# Patient Record
Sex: Male | Born: 1975 | Race: White | Hispanic: No | Marital: Married | Smoking: Former smoker
Health system: Southern US, Community
[De-identification: ages and names within clinical notes are randomized; demographics above are authoritative.]

## PROBLEM LIST (undated history)

## (undated) ENCOUNTER — Ambulatory Visit (HOSPITAL_COMMUNITY): Admission: EM | Payer: Medicare HMO | Source: Home / Self Care

## (undated) DIAGNOSIS — E785 Hyperlipidemia, unspecified: Secondary | ICD-10-CM

## (undated) DIAGNOSIS — R569 Unspecified convulsions: Secondary | ICD-10-CM

## (undated) DIAGNOSIS — J45909 Unspecified asthma, uncomplicated: Secondary | ICD-10-CM

## (undated) HISTORY — PX: HAND SURGERY: SHX662

## (undated) HISTORY — DX: Unspecified asthma, uncomplicated: J45.909

---

## 1999-08-24 ENCOUNTER — Emergency Department (HOSPITAL_COMMUNITY): Admission: EM | Admit: 1999-08-24 | Discharge: 1999-08-24 | Payer: Self-pay | Admitting: Emergency Medicine

## 1999-08-24 ENCOUNTER — Encounter: Payer: Self-pay | Admitting: Emergency Medicine

## 1999-08-26 ENCOUNTER — Emergency Department (HOSPITAL_COMMUNITY): Admission: EM | Admit: 1999-08-26 | Discharge: 1999-08-26 | Payer: Self-pay | Admitting: Internal Medicine

## 1999-09-19 ENCOUNTER — Encounter: Payer: Self-pay | Admitting: Emergency Medicine

## 1999-09-19 ENCOUNTER — Emergency Department (HOSPITAL_COMMUNITY): Admission: EM | Admit: 1999-09-19 | Discharge: 1999-09-19 | Payer: Self-pay | Admitting: *Deleted

## 2002-07-14 ENCOUNTER — Emergency Department (HOSPITAL_COMMUNITY): Admission: EM | Admit: 2002-07-14 | Discharge: 2002-07-14 | Payer: Self-pay | Admitting: Emergency Medicine

## 2004-05-10 ENCOUNTER — Other Ambulatory Visit: Payer: Self-pay

## 2009-06-15 ENCOUNTER — Ambulatory Visit: Payer: Self-pay | Admitting: Family Medicine

## 2009-09-27 ENCOUNTER — Emergency Department: Payer: Self-pay | Admitting: Emergency Medicine

## 2010-04-03 ENCOUNTER — Ambulatory Visit: Payer: Self-pay | Admitting: Unknown Physician Specialty

## 2010-09-12 ENCOUNTER — Encounter: Payer: Self-pay | Admitting: Family Medicine

## 2010-09-28 ENCOUNTER — Encounter: Payer: Self-pay | Admitting: Family Medicine

## 2011-03-21 ENCOUNTER — Ambulatory Visit: Payer: Self-pay | Admitting: Unknown Physician Specialty

## 2011-03-22 LAB — PATHOLOGY REPORT

## 2011-06-02 ENCOUNTER — Emergency Department: Payer: Self-pay | Admitting: Emergency Medicine

## 2011-06-17 ENCOUNTER — Ambulatory Visit: Payer: Self-pay | Admitting: Internal Medicine

## 2011-11-05 ENCOUNTER — Ambulatory Visit: Payer: Self-pay | Admitting: Surgery

## 2012-06-19 ENCOUNTER — Ambulatory Visit: Payer: Self-pay | Admitting: Unknown Physician Specialty

## 2012-06-20 LAB — PATHOLOGY REPORT

## 2012-06-29 ENCOUNTER — Emergency Department: Payer: Self-pay | Admitting: Unknown Physician Specialty

## 2012-06-29 LAB — COMPREHENSIVE METABOLIC PANEL
Albumin: 4 g/dL (ref 3.4–5.0)
Alkaline Phosphatase: 92 U/L (ref 50–136)
Anion Gap: 7 (ref 7–16)
BUN: 17 mg/dL (ref 7–18)
Bilirubin,Total: 0.6 mg/dL (ref 0.2–1.0)
Calcium, Total: 9 mg/dL (ref 8.5–10.1)
Chloride: 107 mmol/L (ref 98–107)
Co2: 24 mmol/L (ref 21–32)
Creatinine: 1.1 mg/dL (ref 0.60–1.30)
EGFR (African American): >60
EGFR (Non-African Amer.): >60
Glucose: 100 mg/dL — ABNORMAL HIGH (ref 65–99)
Osmolality: 277 (ref 275–301)
Potassium: 3.7 mmol/L (ref 3.5–5.1)
SGOT(AST): 20 U/L (ref 15–37)
SGPT (ALT): 18 U/L (ref 12–78)
Sodium: 138 mmol/L (ref 136–145)
Total Protein: 7.6 g/dL (ref 6.4–8.2)

## 2012-06-29 LAB — URINALYSIS, COMPLETE
Bilirubin,UR: NEGATIVE
Glucose,UR: NEGATIVE mg/dL (ref 0–75)
Ketone: NEGATIVE
Leukocyte Esterase: NEGATIVE
Nitrite: NEGATIVE
Ph: 5 (ref 4.5–8.0)
Protein: 100
RBC,UR: 1409 /HPF (ref 0–5)
Specific Gravity: 1.023 (ref 1.003–1.030)
Squamous Epithelial: NONE SEEN
WBC UR: 1 /HPF (ref 0–5)

## 2012-06-29 LAB — CBC
HCT: 49 % (ref 40.0–52.0)
HGB: 17 g/dL (ref 13.0–18.0)
MCH: 32 pg (ref 26.0–34.0)
MCHC: 34.8 g/dL (ref 32.0–36.0)
MCV: 92 fL (ref 80–100)
Platelet: 211 10*3/uL (ref 150–440)
RBC: 5.33 10*6/uL (ref 4.40–5.90)
RDW: 13.9 % (ref 11.5–14.5)
WBC: 5.6 10*3/uL (ref 3.8–10.6)

## 2012-06-29 LAB — LIPASE, BLOOD: Lipase: 130 U/L (ref 73–393)

## 2012-08-12 DIAGNOSIS — R200 Anesthesia of skin: Secondary | ICD-10-CM | POA: Insufficient documentation

## 2012-08-12 DIAGNOSIS — M549 Dorsalgia, unspecified: Secondary | ICD-10-CM | POA: Insufficient documentation

## 2012-08-18 ENCOUNTER — Ambulatory Visit: Payer: Self-pay | Admitting: Internal Medicine

## 2012-08-23 ENCOUNTER — Emergency Department: Payer: Self-pay | Admitting: *Deleted

## 2012-08-23 LAB — BASIC METABOLIC PANEL
Anion Gap: 10 (ref 7–16)
BUN: 17 mg/dL (ref 7–18)
Calcium, Total: 9 mg/dL (ref 8.5–10.1)
Chloride: 108 mmol/L — ABNORMAL HIGH (ref 98–107)
Co2: 25 mmol/L (ref 21–32)
Creatinine: 1.28 mg/dL (ref 0.60–1.30)
EGFR (African American): >60
EGFR (Non-African Amer.): >60
Glucose: 99 mg/dL (ref 65–99)
Osmolality: 287 (ref 275–301)
Potassium: 3.5 mmol/L (ref 3.5–5.1)
Sodium: 143 mmol/L (ref 136–145)

## 2012-08-23 LAB — CBC
HCT: 48.7 % (ref 40.0–52.0)
HGB: 16.8 g/dL (ref 13.0–18.0)
MCH: 32.3 pg (ref 26.0–34.0)
MCV: 94 fL (ref 80–100)
Platelet: 220 10*3/uL (ref 150–440)
RBC: 5.19 10*6/uL (ref 4.40–5.90)
RDW: 13.7 % (ref 11.5–14.5)

## 2012-08-23 LAB — URINALYSIS, COMPLETE
Glucose,UR: NEGATIVE mg/dL (ref 0–75)
Ketone: NEGATIVE
Nitrite: NEGATIVE
Protein: 30
RBC,UR: 391 /HPF (ref 0–5)
WBC UR: NONE SEEN /HPF (ref 0–5)

## 2012-08-25 ENCOUNTER — Ambulatory Visit: Payer: Self-pay | Admitting: Internal Medicine

## 2012-09-01 ENCOUNTER — Encounter: Payer: Self-pay | Admitting: Internal Medicine

## 2012-09-10 DIAGNOSIS — N200 Calculus of kidney: Secondary | ICD-10-CM | POA: Insufficient documentation

## 2012-09-28 ENCOUNTER — Encounter: Payer: Self-pay | Admitting: Internal Medicine

## 2013-02-16 DIAGNOSIS — M25569 Pain in unspecified knee: Secondary | ICD-10-CM | POA: Insufficient documentation

## 2013-05-25 ENCOUNTER — Emergency Department: Payer: Self-pay | Admitting: Emergency Medicine

## 2013-05-25 LAB — COMPREHENSIVE METABOLIC PANEL
Albumin: 3.5 g/dL (ref 3.4–5.0)
Alkaline Phosphatase: 136 U/L (ref 50–136)
Anion Gap: 5 — ABNORMAL LOW (ref 7–16)
Bilirubin,Total: 0.6 mg/dL (ref 0.2–1.0)
Calcium, Total: 8.7 mg/dL (ref 8.5–10.1)
Chloride: 107 mmol/L (ref 98–107)
EGFR (African American): >60
EGFR (Non-African Amer.): >60
Potassium: 3.9 mmol/L (ref 3.5–5.1)
SGOT(AST): 53 U/L — ABNORMAL HIGH (ref 15–37)
Sodium: 137 mmol/L (ref 136–145)

## 2013-05-25 LAB — URINALYSIS, COMPLETE
Bilirubin,UR: NEGATIVE
Blood: NEGATIVE
Glucose,UR: NEGATIVE mg/dL (ref 0–75)
Protein: 30
RBC,UR: 2 /HPF (ref 0–5)
WBC UR: 3 /HPF (ref 0–5)

## 2013-05-25 LAB — CBC
MCV: 91 fL (ref 80–100)
RBC: 5.4 10*6/uL (ref 4.40–5.90)
RDW: 14 % (ref 11.5–14.5)

## 2014-06-15 DIAGNOSIS — J309 Allergic rhinitis, unspecified: Secondary | ICD-10-CM | POA: Insufficient documentation

## 2014-06-15 DIAGNOSIS — J45909 Unspecified asthma, uncomplicated: Secondary | ICD-10-CM | POA: Insufficient documentation

## 2014-06-15 DIAGNOSIS — K219 Gastro-esophageal reflux disease without esophagitis: Secondary | ICD-10-CM | POA: Insufficient documentation

## 2014-06-15 HISTORY — DX: Unspecified asthma, uncomplicated: J45.909

## 2015-01-18 ENCOUNTER — Emergency Department: Payer: Self-pay | Admitting: Emergency Medicine

## 2015-02-15 NOTE — Consult Note (Signed)
PATIENT NAME:  Matthew Wade, Matthew Wade MR#:  [id number] DATE OF BIRTH:  [birth year]  DATE OF CONSULTATION:  06/19/2012  REFERRING PHYSICIAN:   CONSULTING PHYSICIAN:  Manya Silvas, MD  HISTORY OF PRESENT ILLNESS:  The patient is a 39 year old white male who had an upper endoscopy today for followup of duodenal adenomatous polyp.  He in the recovery room complained of heavy chest pain. He said this never happened to him before after his previous endoscopies. The patient did talk to me before the procedure about the amazing stress he is under because his infant daughter had heart surgery for probable ventral septal defect and that there are other problems as well with miscarriages by his wife.  After the procedure he did complain of  chest discomfort. When I pressed on his sternum there was some discomfort as well.  No epigastric discomfort. He did have a small hiatal hernia on endoscopy. An EKG was ordered and done and it showed mild sinus bradycardia without any ST-T wave significant changes. His chest was clear. Heart showed no murmurs or gallops I could hear.  His vital signs were stable. He was given some Mylanta and was told to take his omeprazole. He was feeling better before he was wheeled out to his car.   ____________________________ Manya Silvas, MD rte:bjt D: 06/19/2012 13:29:18 ET T: 06/19/2012 13:59:33 ET JOB#: [id number]  cc: Manya Silvas, MD, <Dictator> Manya Silvas MD ELECTRONICALLY SIGNED 07/08/2012 13:57

## 2015-02-20 NOTE — Op Note (Signed)
PATIENT NAME:  Matthew Wade, Matthew Wade MR#:  [id number] DATE OF BIRTH:  21-Jun-1976  DATE OF PROCEDURE:  11/05/2011  PREOPERATIVE DIAGNOSIS: Ventral hernia.   POSTOPERATIVE DIAGNOSIS: Ventral hernia.   PROCEDURE: Ventral hernia repair.   SURGEON: Loreli Dollar, MD  ANESTHESIA: General.   INDICATIONS: This 39 year old male has a history of a painful mass in the epigastrium and a hernia was demonstrated on physical exam and repair recommended for definitive treatment.   DESCRIPTION OF PROCEDURE: The patient was placed on the operating table in the supine position under general anesthesia. The abdomen was clipped and prepared with ChloraPrep, draped in a sterile manner.   An epigastric incision was made approximately 4 cm in length just above the navel, was carried down through the subcutaneous tissues. Electrocautery was used for hemostasis. There was herniated properitoneal fat which was dissected free from surrounding structures up to the fascial ring defect. The properitoneal fat was incarcerated and it was necessary to enlarge the fascial defect on the left medial side of the defect to allow reduction of the incarcerated properitoneal fat. There appeared to be diastases recti somewhat thin fascial layer. There was another smaller hernia defect just about 8 mm below this one which had just a small amount of properitoneal fat coming up through a hole that was just about 4 mm in dimension. That fatty tissue was amputated and that hole was repaired with a 0 Surgilon simple suture. Next properitoneal fat was dissected away from the fascia with finger dissection both above and below the defect and then selected an Atrium mesh and cut to create an oval shape of 2 x 3 cm and this was placed into the properitoneal plane, sutured to the overlying fascia with 0 Surgilon. Next, the hernia defect was closed with a transversely oriented suture line of interrupted 0 Surgilon simple and figure-of-eight sutures. The  repair looked good. Subcutaneous tissues were closed with 2-0 chromic to avoid dead space and the skin was closed with running 4-0 chromic subcuticular suture and Dermabond.   The patient tolerated surgery satisfactorily and was then prepared for transfer to the recovery room.  ____________________________ Lenna Sciara. Rochel Brome, MD jws:cms D: 11/05/2011 09:57:01 ET T: 11/05/2011 11:21:27 ET JOB#: [id number]  cc: Loreli Dollar, MD, <Dictator> Loreli Dollar MD ELECTRONICALLY SIGNED 12/02/2011 15:02

## 2015-05-17 ENCOUNTER — Emergency Department (HOSPITAL_COMMUNITY): Payer: Medicare Other

## 2015-05-17 ENCOUNTER — Encounter (HOSPITAL_COMMUNITY): Payer: Self-pay | Admitting: Physical Medicine and Rehabilitation

## 2015-05-17 ENCOUNTER — Emergency Department (HOSPITAL_COMMUNITY)
Admission: EM | Admit: 2015-05-17 | Discharge: 2015-05-17 | Disposition: A | Discharge status: Home / Self Care | Payer: Medicare Other | Attending: Emergency Medicine | Admitting: Emergency Medicine

## 2015-05-17 DIAGNOSIS — G8929 Other chronic pain: Secondary | ICD-10-CM | POA: Diagnosis not present

## 2015-05-17 DIAGNOSIS — R110 Nausea: Secondary | ICD-10-CM

## 2015-05-17 DIAGNOSIS — K219 Gastro-esophageal reflux disease without esophagitis: Secondary | ICD-10-CM | POA: Diagnosis not present

## 2015-05-17 DIAGNOSIS — J45909 Unspecified asthma, uncomplicated: Secondary | ICD-10-CM | POA: Diagnosis not present

## 2015-05-17 DIAGNOSIS — K2970 Gastritis, unspecified, without bleeding: Secondary | ICD-10-CM | POA: Insufficient documentation

## 2015-05-17 DIAGNOSIS — Z8639 Personal history of other endocrine, nutritional and metabolic disease: Secondary | ICD-10-CM | POA: Insufficient documentation

## 2015-05-17 DIAGNOSIS — R0789 Other chest pain: Secondary | ICD-10-CM | POA: Diagnosis not present

## 2015-05-17 DIAGNOSIS — R1013 Epigastric pain: Secondary | ICD-10-CM | POA: Diagnosis present

## 2015-05-17 DIAGNOSIS — Z720 Tobacco use: Secondary | ICD-10-CM | POA: Diagnosis not present

## 2015-05-17 DIAGNOSIS — R202 Paresthesia of skin: Secondary | ICD-10-CM | POA: Diagnosis not present

## 2015-05-17 DIAGNOSIS — Z87442 Personal history of urinary calculi: Secondary | ICD-10-CM | POA: Diagnosis not present

## 2015-05-17 HISTORY — DX: Hyperlipidemia, unspecified: E78.5

## 2015-05-17 LAB — CBC WITH DIFFERENTIAL/PLATELET
BASOS PCT: 0 % (ref 0–1)
Basophils Absolute: 0 10*3/uL (ref 0.0–0.1)
EOS PCT: 1 % (ref 0–5)
Eosinophils Absolute: 0.1 10*3/uL (ref 0.0–0.7)
HCT: 51.4 % (ref 39.0–52.0)
Hemoglobin: 18.3 g/dL — ABNORMAL HIGH (ref 13.0–17.0)
LYMPHS ABS: 1.7 10*3/uL (ref 0.7–4.0)
Lymphocytes Relative: 16 % (ref 12–46)
MCH: 32.4 pg (ref 26.0–34.0)
MCHC: 35.6 g/dL (ref 30.0–36.0)
MCV: 91 fL (ref 78.0–100.0)
MONOS PCT: 7 % (ref 3–12)
Monocytes Absolute: 0.7 10*3/uL (ref 0.1–1.0)
Neutro Abs: 8.1 10*3/uL — ABNORMAL HIGH (ref 1.7–7.7)
Neutrophils Relative %: 76 % (ref 43–77)
Platelets: 216 10*3/uL (ref 150–400)
RBC: 5.65 MIL/uL (ref 4.22–5.81)
RDW: 13.1 % (ref 11.5–15.5)
WBC: 10.6 10*3/uL — ABNORMAL HIGH (ref 4.0–10.5)

## 2015-05-17 LAB — COMPREHENSIVE METABOLIC PANEL
ALBUMIN: 4.4 g/dL (ref 3.5–5.0)
ALK PHOS: 71 U/L (ref 38–126)
ALT: 17 U/L (ref 17–63)
ANION GAP: 7 (ref 5–15)
AST: 17 U/L (ref 15–41)
BILIRUBIN TOTAL: 1.4 mg/dL — AB (ref 0.3–1.2)
BUN: 16 mg/dL (ref 6–20)
CHLORIDE: 105 mmol/L (ref 101–111)
CO2: 24 mmol/L (ref 22–32)
CREATININE: 1.12 mg/dL (ref 0.61–1.24)
Calcium: 9.6 mg/dL (ref 8.9–10.3)
GFR calc Af Amer: >60 mL/min (ref >60)
Glucose, Bld: 106 mg/dL — ABNORMAL HIGH (ref 65–99)
Potassium: 3.9 mmol/L (ref 3.5–5.1)
SODIUM: 136 mmol/L (ref 135–145)
Total Protein: 7.7 g/dL (ref 6.5–8.1)

## 2015-05-17 LAB — I-STAT TROPONIN, ED
Troponin i, poc: 0 ng/mL (ref 0.00–0.08)
Troponin i, poc: 0 ng/mL (ref 0.00–0.08)

## 2015-05-17 LAB — LIPASE, BLOOD: LIPASE: 19 U/L — AB (ref 22–51)

## 2015-05-17 MED ORDER — NITROGLYCERIN 0.4 MG SL SUBL: 0.4000 mg | SUBLINGUAL_TABLET | SUBLINGUAL | Status: DC | PRN

## 2015-05-17 MED ORDER — PROMETHAZINE HCL 25 MG PO TABS: 25.0000 mg | ORAL_TABLET | Freq: Four times a day (QID) | ORAL | Status: DC | PRN

## 2015-05-17 MED ORDER — ONDANSETRON 4 MG PO TBDP: 4.0000 mg | ORAL_TABLET | Freq: Three times a day (TID) | ORAL | Status: DC | PRN

## 2015-05-17 MED ORDER — GI COCKTAIL ~~LOC~~
30.0000 mL | Freq: Once | ORAL | Status: AC
  Administered 2015-05-17: 30 mL via ORAL
  Filled 2015-05-17: qty 30

## 2015-05-17 MED ORDER — OMEPRAZOLE 20 MG PO CPDR: 20.0000 mg | DELAYED_RELEASE_CAPSULE | Freq: Every day | ORAL | Status: DC

## 2015-05-17 MED ORDER — MORPHINE SULFATE 4 MG/ML IJ SOLN
4.0000 mg | Freq: Once | INTRAMUSCULAR | Status: AC
  Administered 2015-05-17: 4 mg via INTRAVENOUS
  Filled 2015-05-17: qty 1

## 2015-05-17 MED ORDER — HYDROCODONE-ACETAMINOPHEN 5-325 MG PO TABS: 1.0000 | ORAL_TABLET | Freq: Four times a day (QID) | ORAL | Status: DC | PRN

## 2015-05-17 MED ORDER — PANTOPRAZOLE SODIUM 40 MG IV SOLR
40.0000 mg | Freq: Once | INTRAVENOUS | Status: AC
  Administered 2015-05-17: 40 mg via INTRAVENOUS
  Filled 2015-05-17: qty 40

## 2015-05-17 MED ORDER — ASPIRIN 325 MG PO TABS
325.0000 mg | ORAL_TABLET | Freq: Once | ORAL | Status: AC
  Administered 2015-05-17: 325 mg via ORAL
  Filled 2015-05-17: qty 1

## 2015-05-17 MED ORDER — SODIUM CHLORIDE 0.9 % IV BOLUS (SEPSIS)
1000.0000 mL | Freq: Once | INTRAVENOUS | Status: AC
  Administered 2015-05-17: 1000 mL via INTRAVENOUS

## 2015-05-17 MED ORDER — ONDANSETRON HCL 4 MG/2ML IJ SOLN: 4.0000 mg | Freq: Once | INTRAMUSCULAR | Status: DC

## 2015-05-17 NOTE — Discharge Instructions (Signed)
Your abdominal pain is likely from gastritis from rebound reflux after running out of your omeprazole. You will need to continue taking omeprazole as directed, and avoid spicy/fatty/acidic foods and alcohol. Avoid laying down flat within 30 minutes of eating. Avoid NSAIDs like ibuprofen/aleve on an empty stomach. Use zofran as needed for nausea. Use norco as needed for pain but don't drive or operate machinery while taking this medication. STOP SMOKING! Follow up with your regular doctor in one week for ongoing evaluation of your symptoms. Return to the ER for changes or worsening symptoms.  Abdominal (belly) pain can be caused by many things. Your caregiver performed an examination and possibly ordered blood/urine tests and imaging (CT scan, x-rays, ultrasound). Many cases can be observed and treated at home after initial evaluation in the emergency department. Even though you are being discharged home, abdominal pain can be unpredictable. Therefore, you need a repeated exam if your pain does not resolve, returns, or worsens. Most patients with abdominal pain don't have to be admitted to the hospital or have surgery, but serious problems like appendicitis and gallbladder attacks can start out as nonspecific pain. Many abdominal conditions cannot be diagnosed in one visit, so follow-up evaluations are very important. SEEK IMMEDIATE MEDICAL ATTENTION IF YOU DEVELOP ANY OF THE FOLLOWING SYMPTOMS:  The pain does not go away or becomes severe.   A temperature above 101 develops.   Repeated vomiting occurs (multiple episodes).   The pain becomes localized to portions of the abdomen. The right side could possibly be appendicitis. In an adult, the left lower portion of the abdomen could be colitis or diverticulitis.   Blood is being passed in stools or vomit (bright red or black tarry stools).   Return also if you develop chest pain, difficulty breathing, dizziness or fainting, or become confused, poorly  responsive, or inconsolable (young children).  The constipation stays for more than 4 days.   There is belly (abdominal) or rectal pain.   You do not seem to be getting better.      Abdominal Pain Many things can cause belly (abdominal) pain. Most times, the belly pain is not dangerous. Many cases of belly pain can be watched and treated at home. HOME CARE   Do not take medicines that help you go poop (laxatives) unless told to by your doctor.  Only take medicine as told by your doctor.  Eat or drink as told by your doctor. Your doctor will tell you if you should be on a special diet. GET HELP IF:  You do not know what is causing your belly pain.  You have belly pain while you are sick to your stomach (nauseous) or have runny poop (diarrhea).  You have pain while you pee or poop.  Your belly pain wakes you up at night.  You have belly pain that gets worse or better when you eat.  You have belly pain that gets worse when you eat fatty foods.  You have a fever. GET HELP RIGHT AWAY IF:   The pain does not go away within 2 hours.  You keep throwing up (vomiting).  The pain changes and is only in the right or left part of the belly.  You have bloody or tarry looking poop. MAKE SURE YOU:   Understand these instructions.  Will watch your condition.  Will get help right away if you are not doing well or get worse. Document Released: 04/02/2008 Document Revised: 10/20/2013 Document Reviewed: 06/24/2013 [employer] Patient Information 2015  [employer]. This information is not intended to replace advice given to you by your health care provider. Make sure you discuss any questions you have with your health care provider.  Gastritis, Adult Gastritis is soreness and swelling (inflammation) of the lining of the stomach. Gastritis can develop as a sudden onset (acute) or long-term (chronic) condition. If gastritis is not treated, it can lead to stomach bleeding and  ulcers. CAUSES  Gastritis occurs when the stomach lining is weak or damaged. Digestive juices from the stomach then inflame the weakened stomach lining. The stomach lining may be weak or damaged due to viral or bacterial infections. One common bacterial infection is the Helicobacter pylori infection. Gastritis can also result from excessive alcohol consumption, taking certain medicines, or having too much acid in the stomach.  SYMPTOMS  In some cases, there are no symptoms. When symptoms are present, they may include:  Pain or a burning sensation in the upper abdomen.  Nausea.  Vomiting.  An uncomfortable feeling of fullness after eating. DIAGNOSIS  Your caregiver may suspect you have gastritis based on your symptoms and a physical exam. To determine the cause of your gastritis, your caregiver may perform the following:  Blood or stool tests to check for the H pylori bacterium.  Gastroscopy. A thin, flexible tube (endoscope) is passed down the esophagus and into the stomach. The endoscope has a light and camera on the end. Your caregiver uses the endoscope to view the inside of the stomach.  Taking a tissue sample (biopsy) from the stomach to examine under a microscope. TREATMENT  Depending on the cause of your gastritis, medicines may be prescribed. If you have a bacterial infection, such as an H pylori infection, antibiotics may be given. If your gastritis is caused by too much acid in the stomach, H2 blockers or antacids may be given. Your caregiver may recommend that you stop taking aspirin, ibuprofen, or other nonsteroidal anti-inflammatory drugs (NSAIDs). HOME CARE INSTRUCTIONS  Only take over-the-counter or prescription medicines as directed by your caregiver.  If you were given antibiotic medicines, take them as directed. Finish them even if you start to feel better.  Drink enough fluids to keep your urine clear or pale yellow.  Avoid foods and drinks that make your symptoms  worse, such as:  Caffeine or alcoholic drinks.  Chocolate.  Peppermint or mint flavorings.  Garlic and onions.  Spicy foods.  Citrus fruits, such as oranges, lemons, or limes.  Tomato-based foods such as sauce, chili, salsa, and pizza.  Fried and fatty foods.  Eat small, frequent meals instead of large meals. SEEK IMMEDIATE MEDICAL CARE IF:   You have black or dark red stools.  You vomit blood or material that looks like coffee grounds.  You are unable to keep fluids down.  Your abdominal pain gets worse.  You have a fever.  You do not feel better after 1 week.  You have any other questions or concerns. MAKE SURE YOU:  Understand these instructions.  Will watch your condition.  Will get help right away if you are not doing well or get worse. Document Released: 10/09/2001 Document Revised: 04/15/2012 Document Reviewed: 11/28/2011 [facility] Patient Information 2015 [place], [place]. This information is not intended to replace advice given to you by your health care provider. Make sure you discuss any questions you have with your health care provider.  Gastroesophageal Reflux Disease, Adult Gastroesophageal reflux disease (GERD) happens when acid from your stomach flows up into the esophagus. When acid  comes in contact with the esophagus, the acid causes soreness (inflammation) in the esophagus. Over time, GERD may create small holes (ulcers) in the lining of the esophagus. CAUSES   Increased body weight. This puts pressure on the stomach, making acid rise from the stomach into the esophagus.  Smoking. This increases acid production in the stomach.  Drinking alcohol. This causes decreased pressure in the lower esophageal sphincter (valve or ring of muscle between the esophagus and stomach), allowing acid from the stomach into the esophagus.  Late evening meals and a full stomach. This increases pressure and acid production in the stomach.  A malformed lower  esophageal sphincter. Sometimes, no cause is found. SYMPTOMS   Burning pain in the lower part of the mid-chest behind the breastbone and in the mid-stomach area. This may occur twice a week or more often.  Trouble swallowing.  Sore throat.  Dry cough.  Asthma-like symptoms including chest tightness, shortness of breath, or wheezing. DIAGNOSIS  Your caregiver may be able to diagnose GERD based on your symptoms. In some cases, X-rays and other tests may be done to check for complications or to check the condition of your stomach and esophagus. TREATMENT  Your caregiver may recommend over-the-counter or prescription medicines to help decrease acid production. Ask your caregiver before starting or adding any new medicines.  HOME CARE INSTRUCTIONS   Change the factors that you can control. Ask your caregiver for guidance concerning weight loss, quitting smoking, and alcohol consumption.  Avoid foods and drinks that make your symptoms worse, such as:  Caffeine or alcoholic drinks.  Chocolate.  Peppermint or mint flavorings.  Garlic and onions.  Spicy foods.  Citrus fruits, such as oranges, lemons, or limes.  Tomato-based foods such as sauce, chili, salsa, and pizza.  Fried and fatty foods.  Avoid lying down for the 3 hours prior to your bedtime or prior to taking a nap.  Eat small, frequent meals instead of large meals.  Wear loose-fitting clothing. Do not wear anything tight around your waist that causes pressure on your stomach.  Raise the head of your bed 6 to 8 inches with wood blocks to help you sleep. Extra pillows will not help.  Only take over-the-counter or prescription medicines for pain, discomfort, or fever as directed by your caregiver.  Do not take aspirin, ibuprofen, or other nonsteroidal anti-inflammatory drugs (NSAIDs). SEEK IMMEDIATE MEDICAL CARE IF:   You have pain in your arms, neck, jaw, teeth, or back.  Your pain increases or changes in intensity  or duration.  You develop nausea, vomiting, or sweating (diaphoresis).  You develop shortness of breath, or you faint.  Your vomit is green, yellow, black, or looks like coffee grounds or blood.  Your stool is red, bloody, or black. These symptoms could be signs of other problems, such as heart disease, gastric bleeding, or esophageal bleeding. MAKE SURE YOU:   Understand these instructions.  Will watch your condition.  Will get help right away if you are not doing well or get worse. Document Released: 07/25/2005 Document Revised: 01/07/2012 Document Reviewed: 05/04/2011 [facility] Patient Information 2015 [place], [place]. This information is not intended to replace advice given to you by your health care provider. Make sure you discuss any questions you have with your health care provider.  Food Choices for Gastroesophageal Reflux Disease When you have gastroesophageal reflux disease (GERD), the foods you eat and your eating habits are very important. Choosing the right foods can help ease your discomfort.  WHAT  GUIDELINES DO I NEED TO FOLLOW?   Choose fruits, vegetables, whole grains, and low-fat dairy products.   Choose low-fat meat, fish, and poultry.  Limit fats such as oils, salad dressings, butter, nuts, and avocado.   Keep a food diary. This helps you identify foods that cause symptoms.   Avoid foods that cause symptoms. These may be different for everyone.   Eat small meals often instead of 3 large meals a day.   Eat your meals slowly, in a place where you are relaxed.   Limit fried foods.   Cook foods using methods other than frying.   Avoid drinking alcohol.   Avoid drinking large amounts of liquids with your meals.   Avoid bending over or lying down until 2-3 hours after eating.  WHAT FOODS ARE NOT RECOMMENDED?  These are some foods and drinks that may make your symptoms worse: Vegetables Tomatoes. Tomato juice. Tomato and spaghetti sauce. Chili  peppers. Onion and garlic. Horseradish. Fruits Oranges, grapefruit, and lemon (fruit and juice). Meats High-fat meats, fish, and poultry. This includes hot dogs, ribs, ham, sausage, salami, and bacon. Dairy Whole milk and chocolate milk. Sour cream. Cream. Butter. Ice cream. Cream cheese.  Drinks Coffee and tea. Bubbly (carbonated) drinks or energy drinks. Condiments Hot sauce. Barbecue sauce.  Sweets/Desserts Chocolate and cocoa. Donuts. Peppermint and spearmint. Fats and Oils High-fat foods. This includes [place] fries and potato chips. Other Vinegar. Strong spices. This includes black pepper, white pepper, red pepper, cayenne, curry powder, cloves, ginger, and chili powder. The items listed above may not be a complete list of foods and drinks to avoid. Contact your dietitian for more information. Document Released: 04/15/2012 Document Revised: 10/20/2013 Document Reviewed: 08/19/2013 [facility] Patient Information 2015 [place], [place]. This information is not intended to replace advice given to you by your health care provider. Make sure you discuss any questions you have with your health care provider.  Nausea, Adult Nausea means you feel sick to your stomach or need to throw up (vomit). It may be a sign of a more serious problem. If nausea gets worse, you may throw up. If you throw up a lot, you may lose too much body fluid (dehydration). HOME CARE   Get plenty of rest.  Ask your doctor how to replace body fluid losses (rehydrate).  Eat small amounts of food. Sip liquids more often.  Take all medicines as told by your doctor. GET HELP RIGHT AWAY IF:  You have a fever.  You pass out (faint).  You keep throwing up or have blood in your throw up.  You are very weak, have dry lips or a dry mouth, or you are very thirsty (dehydrated).  You have dark or bloody poop (stool).  You have very bad chest or belly (abdominal) pain.  You do not get better after 2 days, or you get  worse.  You have a headache. MAKE SURE YOU:  Understand these instructions.  Will watch your condition.  Will get help right away if you are not doing well or get worse. Document Released: 10/04/2011 Document Revised: 01/07/2012 Document Reviewed: 10/04/2011 [facility] Patient Information 2015 [place], [place]. This information is not intended to replace advice given to you by your health care provider. Make sure you discuss any questions you have with your health care provider.  Smoking Cessation, Tips for Success If you are ready to quit smoking, congratulations! You have chosen to help yourself be healthier. Cigarettes bring nicotine, tar, carbon monoxide, and other irritants into  your body. Your lungs, heart, and blood vessels will be able to work better without these poisons. There are many different ways to quit smoking. Nicotine gum, nicotine patches, a nicotine inhaler, or nicotine nasal spray can help with physical craving. Hypnosis, support groups, and medicines help break the habit of smoking. WHAT THINGS CAN I DO TO MAKE QUITTING EASIER?  Here are some tips to help you quit for good:  Pick a date when you will quit smoking completely. Tell all of your friends and family about your plan to quit on that date.  Do not try to slowly cut down on the number of cigarettes you are smoking. Pick a quit date and quit smoking completely starting on that day.  Throw away all cigarettes.   Clean and remove all ashtrays from your home, work, and car.  On a card, write down your reasons for quitting. Carry the card with you and read it when you get the urge to smoke.  Cleanse your body of nicotine. Drink enough water and fluids to keep your urine clear or pale yellow. Do this after quitting to flush the nicotine from your body.  Learn to predict your moods. Do not let a bad situation be your excuse to have a cigarette. Some situations in your life might tempt you into wanting a  cigarette.  Never have "just one" cigarette. It leads to wanting another and another. Remind yourself of your decision to quit.  Change habits associated with smoking. If you smoked while driving or when feeling stressed, try other activities to replace smoking. Stand up when drinking your coffee. Brush your teeth after eating. Sit in a different chair when you read the paper. Avoid alcohol while trying to quit, and try to drink fewer caffeinated beverages. Alcohol and caffeine may urge you to smoke.  Avoid foods and drinks that can trigger a desire to smoke, such as sugary or spicy foods and alcohol.  Ask people who smoke not to smoke around you.  Have something planned to do right after eating or having a cup of coffee. For example, plan to take a walk or exercise.  Try a relaxation exercise to calm you down and decrease your stress. Remember, you may be tense and nervous for the first 2 weeks after you quit, but this will pass.  Find new activities to keep your hands busy. Play with a pen, coin, or rubber band. Doodle or draw things on paper.  Brush your teeth right after eating. This will help cut down on the craving for the taste of tobacco after meals. You can also try mouthwash.   Use oral substitutes in place of cigarettes. Try using lemon drops, carrots, cinnamon sticks, or chewing gum. Keep them handy so they are available when you have the urge to smoke.  When you have the urge to smoke, try deep breathing.  Designate your home as a nonsmoking area.  If you are a heavy smoker, ask your health care provider about a prescription for nicotine chewing gum. It can ease your withdrawal from nicotine.  Reward yourself. Set aside the cigarette money you save and buy yourself something nice.  Look for support from others. Join a support group or smoking cessation program. Ask someone at home or at work to help you with your plan to quit smoking.  Always ask yourself, "Do I need this  cigarette or is this just a reflex?" Tell yourself, "Today, I choose not to smoke," or "I do not  want to smoke." You are reminding yourself of your decision to quit.  Do not replace cigarette smoking with electronic cigarettes (commonly called e-cigarettes). The safety of e-cigarettes is unknown, and some may contain harmful chemicals.  If you relapse, do not give up! Plan ahead and think about what you will do the next time you get the urge to smoke. HOW WILL I FEEL WHEN I QUIT SMOKING? You may have symptoms of withdrawal because your body is used to nicotine (the addictive substance in cigarettes). You may crave cigarettes, be irritable, feel very hungry, cough often, get headaches, or have difficulty concentrating. The withdrawal symptoms are only temporary. They are strongest when you first quit but will go away within 10-14 days. When withdrawal symptoms occur, stay in control. Think about your reasons for quitting. Remind yourself that these are signs that your body is healing and getting used to being without cigarettes. Remember that withdrawal symptoms are easier to treat than the major diseases that smoking can cause.  Even after the withdrawal is over, expect periodic urges to smoke. However, these cravings are generally short lived and will go away whether you smoke or not. Do not smoke! WHAT RESOURCES ARE AVAILABLE TO HELP ME QUIT SMOKING? Your health care provider can direct you to community resources or hospitals for support, which may include:  Group support.  Education.  Hypnosis.  Therapy. Document Released: 07/13/2004 Document Revised: 03/01/2014 Document Reviewed: 04/02/2013 [facility] Patient Information 2015 [place], [place]. This information is not intended to replace advice given to you by your health care provider. Make sure you discuss any questions you have with your health care provider.

## 2015-05-17 NOTE — ED Notes (Signed)
Pt presents to department for evaluation of epigastric pain and nausea. Ongoing x2 days. 9/10 pain upon arrival to ED. Respirations unlabored. Pt is alert and oriented x4.

## 2015-05-17 NOTE — ED Provider Notes (Signed)
CSN: [id number]     Arrival date & time 05/17/15  1044 History   First MD Initiated Contact with Patient 05/17/15 1059     Chief Complaint  Patient presents with  . Abdominal Pain  . Nausea     (Consider location/radiation/quality/duration/timing/severity/associated sxs/prior Treatment) HPI Comments: Matthew Wade. is a 39 y.o. male with a PMHx of HLD, chronic back pain, R leg numbness/tingling, nephrolithiasis, GERD, nausea, asthma, and umbilical hernia, who presents to the ED with complaints of epigastric pain that began yesterday. He reports the pain is 9/10 constant burning and sharp pain radiating to the left and right upper quadrants as well as in the center of his chest, worse with movement, with no treatments tried prior to arrival. Associated symptoms include nausea, left arm tingling, shortness breath, and diaphoresis on the way here. He reports he has a history of GERD and takes omeprazole daily, but he ran out yesterday. He admits to drinking one mixed liquor drink every night. He denies any fevers, chills, cough, leg swelling, recent travel/surgery/immobilization, history of DVT, claudication, orthopnea, vomiting, melena, hematochezia, obstipation, constipation, diarrhea, dysuria, hematuria, flank pain, numbness, weakness, neck or back pain, lightheadedness, chronic NSAID use, suspicious food intake, sick contacts, or recent antibiotic. He is a smoker, and has a significant family history of MI in paternal grandmother and aunt, and other cardiac disease in father side of the family. No personal hx of cardiac disease known to himself.  Patient is a 39 y.o. male presenting with abdominal pain. The history is provided by the patient. No language interpreter was used.  Abdominal Pain Pain location:  Epigastric Pain quality: burning and sharp   Pain radiates to:  Chest, LUQ and RUQ Pain severity:  Moderate Onset quality:  Gradual Duration:  1 day Timing:  Constant Progression:   Unchanged Chronicity:  New Context: alcohol use and medication withdrawal   Context: not recent illness, not recent travel, not sick contacts and not suspicious food intake   Relieved by:  None tried Worsened by:  Movement Ineffective treatments:  None tried Associated symptoms: chest pain, nausea and shortness of breath   Associated symptoms: no chills, no constipation, no cough, no diarrhea, no dysuria, no fever, no flatus, no hematemesis, no hematochezia, no hematuria, no melena and no vomiting   Risk factors: alcohol abuse   Risk factors: no NSAID use     No past medical history on file. No past surgical history on file. No family history on file. History  Substance Use Topics  . Smoking status: Not on file  . Smokeless tobacco: Not on file  . Alcohol Use: Not on file    Review of Systems  Constitutional: Positive for diaphoresis. Negative for fever and chills.  HENT: Postnasal drip: radiating from epigastrum.   Respiratory: Positive for shortness of breath. Negative for cough.   Cardiovascular: Positive for chest pain.  Gastrointestinal: Positive for nausea and abdominal pain. Negative for vomiting, diarrhea, constipation, blood in stool, melena, hematochezia, flatus and hematemesis.  Genitourinary: Negative for dysuria, hematuria and flank pain.  Musculoskeletal: Negative for myalgias, back pain, arthralgias and neck pain.  Skin: Negative for color change.  Allergic/Immunologic: Negative for immunocompromised state.  Neurological: Negative for weakness, light-headedness and numbness.       +L arm tingling  Psychiatric/Behavioral: Negative for confusion.   10 Systems reviewed and are negative for acute change except as noted in the HPI.    Allergies  Review of patient's allergies indicates not on file.  Home Medications   Prior to Admission medications   Not on File   BP 120/83 mmHg  Pulse 92  Temp(Src) 98 F (36.7 C) (Oral)  Resp 18  Ht 5\' 8"  (1.727 m)  Wt  190 lb (86.183 kg)  BMI 28.90 kg/m2  SpO2 99% Physical Exam  Constitutional: He is oriented to person, place, and time. Vital signs are normal. He appears well-developed and well-nourished.  Non-toxic appearance. No distress.  Afebrile, nontoxic, NAD  HENT:  Head: Normocephalic and atraumatic.  Mouth/Throat: Oropharynx is clear and moist and mucous membranes are normal.  Eyes: Conjunctivae and EOM are normal. Right eye exhibits no discharge. Left eye exhibits no discharge.  Neck: Normal range of motion. Neck supple.  Cardiovascular: Normal rate, regular rhythm, normal heart sounds and intact distal pulses.  Exam reveals no gallop and no friction rub.   No murmur heard. RRR, nl s1/s2, no m/r/g, distal pulses intact, no pedal edema   Pulmonary/Chest: Effort normal and breath sounds normal. No respiratory distress. He has no decreased breath sounds. He has no wheezes. He has no rhonchi. He has no rales. He exhibits tenderness. He exhibits no crepitus, no deformity and no retraction.    CTAB in all lung fields, no w/r/r, no hypoxia or increased WOB, speaking in full sentences, SpO2 96% on RA Chest wall tenderness from epigastrum/xyphoid along sternum, no crepitus or deformity, no retractions  Abdominal: Soft. Normal appearance and bowel sounds are normal. He exhibits no distension. There is tenderness in the epigastric area. There is no rigidity, no rebound, no guarding, no CVA tenderness, no tenderness at McBurney's point and negative Murphy's sign.    Soft, nondistended, +BS throughout, with epigastric tenderness, no r/g/r, neg murphy's, neg mcburney's, no CVA TTP   Musculoskeletal: Normal range of motion.  MAE x4 Strength and sensation grossly intact Distal pulses intact No pedal edema, neg homan's bilaterally   Neurological: He is alert and oriented to person, place, and time. He has normal strength. No sensory deficit.  Skin: Skin is warm, dry and intact. No rash noted.  Psychiatric:  He has a normal mood and affect.  Nursing note and vitals reviewed.   ED Course  Procedures (including critical care time) Labs Review Labs Reviewed  CBC WITH DIFFERENTIAL/PLATELET - Abnormal; Notable for the following:    WBC 10.6 (*)    Hemoglobin 18.3 (*)    Neutro Abs 8.1 (*)    All other components within normal limits  COMPREHENSIVE METABOLIC PANEL - Abnormal; Notable for the following:    Glucose, Bld 106 (*)    Total Bilirubin 1.4 (*)    All other components within normal limits  LIPASE, BLOOD - Abnormal; Notable for the following:    Lipase 19 (*)    All other components within normal limits  I-STAT TROPOININ, ED  I-STAT TROPOININ, ED    Imaging Review Dg Chest 2 View  05/17/2015   CLINICAL DATA:  Chest pain, shortness of breath, LEFT arm numbness, nausea and hot flashes starting this morning, history smoking, hyperlipidemia  EXAM: CHEST  2 VIEW  COMPARISON:  05/25/2013  FINDINGS: Normal heart size, mediastinal contours and pulmonary vascularity.  Lungs clear.  No pleural effusion or pneumothorax.  Minimal thoracolumbar scoliosis.  IMPRESSION: No acute abnormalities.   Electronically Signed   By: Lavonia Dana M.D.   On: 05/17/2015 12:15     EKG Interpretation   Date/Time:  Tuesday May 17 2015 11:04:38 EDT Ventricular Rate:  71 PR Interval:  116 QRS Duration: 86 QT Interval:  354 QTC Calculation: 384 R Axis:   95 Text Interpretation:  Normal sinus rhythm with sinus arrhythmia Rightward  axis Borderline ECG No significant change was found Confirmed by CAMPOS   MD, Lennette Bihari ([id number]) on 05/17/2015 12:40:36 PM      MDM   Final diagnoses:  Epigastric abdominal pain  Nausea  Gastritis  Gastroesophageal reflux disease, esophagitis presence not specified  Tobacco use  Atypical chest pain    39 y.o. male here with epigastric pain and nausea x1 day, radiating to chest, associated with L arm tingling and SOB. +Smoker. +FHx of cardiac disease, no known personal hx.  Admits to EtOH use every night. Has hx of gastritis/GERD. Exam reveals epigastric tenderness and sternal tenderness, no tachycardia or hypoxia, doubt PE. Doubt dissection. Will obtain labs, EKG, CXR, and give ASA, morphine, GI cocktail, zofran, and NTG. Will reassess shortly.   12:42 PM Trop neg. CBC showing mildly elevated WBC with Hgb 18.3, likely hemoconcentrated. CMP WNL. Lipase WNL. CXR clear. EKG unchanged and without concerning findings. Symptoms improving after ASA, morphine, GI cocktail, protonix, and fluids, still has some epigastric discomfort but chest discomfort/arm tingling/SOB is resolved. No longer nauseated. Doubt need to give NTG. Nausea resolved without zofran. Tolerating PO well now. Discussed that given that he has some RFs, but relatively low HEART score, will repeat trop at 3hr mark and if negative then this all was likely from gastritis/rebound reflux from running out of omeprazole. Will monitor and reassess shortly. Pt agrees with this plan.  2:50 PM Second trop neg. Pt asymptomatic, currently sleeping. Discussed that this is likely reflux. Discussed diet modifications and smoking cessation. Will rx norco PRN pain, zofran, and omeprazole. Pt requested phenergan, will write this as well. Will have him f/up with PCP (Dr. Baldemar Lenis) in 1wk for recheck and ongoing management of epigastric pain/gastritis. I explained the diagnosis and have given explicit precautions to return to the ER including for any other new or worsening symptoms. The patient understands and accepts the medical plan as it's been dictated and I have answered their questions. Discharge instructions concerning home care and prescriptions have been given. The patient is STABLE and is discharged to home in good condition.  BP 110/72 mmHg  Pulse 53  Temp(Src) 98 F (36.7 C) (Oral)  Resp 16  Ht 5\' 8"  (1.727 m)  Wt 190 lb (86.183 kg)  BMI 28.90 kg/m2  SpO2 95%  Meds ordered this encounter  Medications  . aspirin  tablet 325 mg    Sig:   . morphine 4 MG/ML injection 4 mg    Sig:   . gi cocktail (Maalox,Lidocaine,Donnatal)    Sig:   . pantoprazole (PROTONIX) injection 40 mg    Sig:   . sodium chloride 0.9 % bolus 1,000 mL    Sig:   . omeprazole (PRILOSEC) 20 MG capsule    Sig: Take 1 capsule (20 mg total) by mouth daily.    Dispense:  30 capsule    Refill:  0    Order Specific Question:  Supervising Provider    Answer:  MILLER, BRIAN [[id number]]  . ondansetron (ZOFRAN ODT) 4 MG disintegrating tablet    Sig: Take 1 tablet (4 mg total) by mouth every 8 (eight) hours as needed for nausea or vomiting.    Dispense:  15 tablet    Refill:  0    Order Specific Question:  Supervising Provider    Answer:  MILLER, BRIAN [[id number]]  .  HYDROcodone-acetaminophen (NORCO) 5-325 MG per tablet    Sig: Take 1 tablet by mouth every 6 (six) hours as needed for severe pain.    Dispense:  6 tablet    Refill:  0    Order Specific Question:  Supervising Provider    Answer:  MILLER, BRIAN [[id number]]  . promethazine (PHENERGAN) 25 MG tablet    Sig: Take 1 tablet (25 mg total) by mouth every 6 (six) hours as needed for nausea or vomiting.    Dispense:  10 tablet    Refill:  0    Order Specific Question:  Supervising Provider    Answer:  Noemi Chapel [[id number]]     Salam Chesterfield Camprubi-Soms, PA-C 05/17/15 1452  Jola Schmidt, MD 05/17/15 1501

## 2015-08-07 ENCOUNTER — Encounter (HOSPITAL_COMMUNITY): Payer: Self-pay | Admitting: Emergency Medicine

## 2015-08-07 ENCOUNTER — Emergency Department (HOSPITAL_COMMUNITY)
Admission: EM | Admit: 2015-08-07 | Discharge: 2015-08-07 | Disposition: A | Discharge status: Home / Self Care | Payer: Medicare Other | Attending: Emergency Medicine | Admitting: Emergency Medicine

## 2015-08-07 DIAGNOSIS — K219 Gastro-esophageal reflux disease without esophagitis: Secondary | ICD-10-CM | POA: Diagnosis not present

## 2015-08-07 DIAGNOSIS — M62830 Muscle spasm of back: Secondary | ICD-10-CM | POA: Diagnosis not present

## 2015-08-07 DIAGNOSIS — Z720 Tobacco use: Secondary | ICD-10-CM | POA: Insufficient documentation

## 2015-08-07 DIAGNOSIS — R319 Hematuria, unspecified: Secondary | ICD-10-CM | POA: Diagnosis not present

## 2015-08-07 DIAGNOSIS — Z87442 Personal history of urinary calculi: Secondary | ICD-10-CM | POA: Insufficient documentation

## 2015-08-07 DIAGNOSIS — J45909 Unspecified asthma, uncomplicated: Secondary | ICD-10-CM | POA: Diagnosis not present

## 2015-08-07 DIAGNOSIS — G8929 Other chronic pain: Secondary | ICD-10-CM | POA: Insufficient documentation

## 2015-08-07 DIAGNOSIS — Z8639 Personal history of other endocrine, nutritional and metabolic disease: Secondary | ICD-10-CM | POA: Insufficient documentation

## 2015-08-07 DIAGNOSIS — R1032 Left lower quadrant pain: Secondary | ICD-10-CM | POA: Diagnosis not present

## 2015-08-07 DIAGNOSIS — M545 Low back pain: Secondary | ICD-10-CM | POA: Diagnosis present

## 2015-08-07 DIAGNOSIS — Z79899 Other long term (current) drug therapy: Secondary | ICD-10-CM | POA: Insufficient documentation

## 2015-08-07 DIAGNOSIS — M549 Dorsalgia, unspecified: Secondary | ICD-10-CM

## 2015-08-07 LAB — URINALYSIS, ROUTINE W REFLEX MICROSCOPIC
BILIRUBIN URINE: NEGATIVE
Glucose, UA: NEGATIVE mg/dL
Ketones, ur: NEGATIVE mg/dL
Nitrite: NEGATIVE
PH: 5.5 (ref 5.0–8.0)
Protein, ur: 30 mg/dL — AB
UROBILINOGEN UA: 0.2 mg/dL (ref 0.0–1.0)

## 2015-08-07 LAB — CBC WITH DIFFERENTIAL/PLATELET
Basophils Absolute: 0 10*3/uL (ref 0.0–0.1)
Basophils Relative: 0 %
EOS ABS: 0.1 10*3/uL (ref 0.0–0.7)
Eosinophils Relative: 1 %
HCT: 50.1 % (ref 39.0–52.0)
HEMOGLOBIN: 17.6 g/dL — AB (ref 13.0–17.0)
LYMPHS PCT: 34 %
Lymphs Abs: 2.3 10*3/uL (ref 0.7–4.0)
MCH: 32.5 pg (ref 26.0–34.0)
MCHC: 35.1 g/dL (ref 30.0–36.0)
MCV: 92.6 fL (ref 78.0–100.0)
MONOS PCT: 8 %
Monocytes Absolute: 0.6 10*3/uL (ref 0.1–1.0)
NEUTROS PCT: 57 %
Neutro Abs: 3.7 10*3/uL (ref 1.7–7.7)
Platelets: 222 10*3/uL (ref 150–400)
RBC: 5.41 MIL/uL (ref 4.22–5.81)
RDW: 13 % (ref 11.5–15.5)
WBC: 6.7 10*3/uL (ref 4.0–10.5)

## 2015-08-07 LAB — BASIC METABOLIC PANEL
Anion gap: 10 (ref 5–15)
BUN: 22 mg/dL — AB (ref 6–20)
CO2: 23 mmol/L (ref 22–32)
CREATININE: 1.02 mg/dL (ref 0.61–1.24)
Calcium: 9.7 mg/dL (ref 8.9–10.3)
Chloride: 105 mmol/L (ref 101–111)
GFR calc Af Amer: >60 mL/min (ref >60)
GFR calc non Af Amer: >60 mL/min (ref >60)
Glucose, Bld: 112 mg/dL — ABNORMAL HIGH (ref 65–99)
Potassium: 4.2 mmol/L (ref 3.5–5.1)
SODIUM: 138 mmol/L (ref 135–145)

## 2015-08-07 LAB — URINE MICROSCOPIC-ADD ON

## 2015-08-07 MED ORDER — LIDOCAINE HCL (PF) 1 % IJ SOLN
INTRAMUSCULAR | Status: AC
  Administered 2015-08-07: 0.9 mL
  Filled 2015-08-07: qty 5

## 2015-08-07 MED ORDER — HYDROCODONE-ACETAMINOPHEN 5-325 MG PO TABS
1.0000 | ORAL_TABLET | Freq: Once | ORAL | Status: DC
  Filled 2015-08-07: qty 1

## 2015-08-07 MED ORDER — CEFTRIAXONE SODIUM 250 MG IJ SOLR
250.0000 mg | Freq: Once | INTRAMUSCULAR | Status: AC
  Administered 2015-08-07: 250 mg via INTRAMUSCULAR
  Filled 2015-08-07: qty 250

## 2015-08-07 MED ORDER — AZITHROMYCIN 250 MG PO TABS
1000.0000 mg | ORAL_TABLET | Freq: Once | ORAL | Status: AC
Start: 2015-08-07 — End: 2015-08-07
  Administered 2015-08-07: 1000 mg via ORAL
  Filled 2015-08-07: qty 4

## 2015-08-07 NOTE — ED Notes (Signed)
Pt c/o back pain x 2 months, Pt reports pain now in groin area with decreased sexual function x 2 days. Pt reports pain on left side.

## 2015-08-07 NOTE — ED Provider Notes (Signed)
CSN: [id number]     Arrival date & time 08/07/15  1445 History   First MD Initiated Contact with Patient 08/07/15 1510     Chief Complaint  Patient presents with  . Back Pain  . Groin Pain     (Consider location/radiation/quality/duration/timing/severity/associated sxs/prior Treatment) HPI Comments: Matthew Wade. is a 39 y.o. male with a PMHx of HLD, chronic back pain, R leg numbness/tingling, nephrolithiasis, GERD, nausea, asthma, and umbilical hernia, who presents to the ED with complaints of chronic lower back pain which he describes as 5/10 constant aching in the left side of his lower back, intermittently radiating to the left groin, worse with movement, and minimally improved with ibuprofen, Vicodin, Flexeril, and heat. He states this back pain has been going on for 2 months, but the radiation of pain to his groin has only been going on for 2 days. He also states that he has had difficulty in achieving ejaculation at times, but states this morning he had no issues with a ejaculation. Reports it's never painful to ejaculate. He states he is sexually active with 1 partner, his wife, unprotected. He states that this time he is not having any testicular pain, but when the pain shoots to his left groin it seems to be somewhat into the left testicle as well. He denies any fevers, chills, chest pain, shortness of breath, abdominal pain, nausea, vomiting, diarrhea, constipation, dysuria, hematuria, penile discharge, testicular swelling, difficulty achieving erection, numbness, tingling, weakness, cauda equina symptoms, incontinence of urine or stool, or painful ejaculation. Denies recent injuries. Reports this feels different than his prior kidney stones because it's less severe and it isn't causing any nausea, nor is it accompanied with hematuria.  Patient is a 39 y.o. male presenting with back pain and groin pain. The history is provided by the patient and medical records. No language interpreter  was used.  Back Pain Location:  Lumbar spine Quality:  Aching Pain severity:  Moderate Pain is:  Same all the time Onset quality:  Gradual Duration:  2 months Timing:  Constant Progression:  Unchanged Chronicity:  Chronic Context: not lifting heavy objects, not recent injury and not twisting   Relieved by:  Ibuprofen, muscle relaxants, narcotics and heating pad Worsened by:  Movement Ineffective treatments:  None tried Associated symptoms: no abdominal pain, no bladder incontinence, no bowel incontinence, no chest pain, no dysuria, no fever, no numbness, no paresthesias, no perianal numbness, no tingling and no weakness   Groin Pain Pertinent negatives include no abdominal pain, arthralgias, chest pain, chills, fever, myalgias, nausea, numbness, vomiting or weakness.    Past Medical History  Diagnosis Date  . Hyperlipemia    History reviewed. No pertinent past surgical history. No family history on file. Social History  Substance Use Topics  . Smoking status: Current Every Day Smoker    Types: Cigarettes  . Smokeless tobacco: None  . Alcohol Use: Yes    Review of Systems  Constitutional: Negative for fever and chills.  Respiratory: Negative for shortness of breath.   Cardiovascular: Negative for chest pain.  Gastrointestinal: Negative for nausea, vomiting, abdominal pain, diarrhea, constipation and bowel incontinence.  Genitourinary: Positive for testicular pain (intermittent but none right now). Negative for bladder incontinence, dysuria, hematuria, discharge, penile swelling, scrotal swelling, genital sores and penile pain.       +difficulty ejaculating at times, no pain with ejaculation  Musculoskeletal: Positive for back pain. Negative for myalgias and arthralgias.  Skin: Negative for color change.  Allergic/Immunologic:  Negative for immunocompromised state.  Neurological: Negative for tingling, weakness, numbness and paresthesias.  Psychiatric/Behavioral: Negative  for confusion.   10 Systems reviewed and are negative for acute change except as noted in the HPI.    Allergies  Review of patient's allergies indicates no known allergies.  Home Medications   Prior to Admission medications   Medication Sig Start Date End Date Taking? Authorizing Provider  HYDROcodone-acetaminophen (NORCO) 5-325 MG per tablet Take 1 tablet by mouth every 6 (six) hours as needed for severe pain. 05/17/15   Mendy Chou Camprubi-Soms, PA-C  omeprazole (PRILOSEC) 20 MG capsule Take 1 capsule (20 mg total) by mouth daily. 05/17/15   Corinna Burkman Camprubi-Soms, PA-C  ondansetron (ZOFRAN ODT) 4 MG disintegrating tablet Take 1 tablet (4 mg total) by mouth every 8 (eight) hours as needed for nausea or vomiting. 05/17/15   Revanth Neidig Camprubi-Soms, PA-C  promethazine (PHENERGAN) 25 MG tablet Take 1 tablet (25 mg total) by mouth every 6 (six) hours as needed for nausea or vomiting. 05/17/15   Gurney Balthazor Camprubi-Soms, PA-C    Triage VS: BP 115/77 mmHg  Pulse 109  Temp(Src) 98.1 F (36.7 C) (Oral)  Resp 16  Ht 5\' 8"  (1.727 m)  Wt 173 lb 9.6 oz (78.744 kg)  BMI 26.40 kg/m2  SpO2 96% Exam VS: BP 120/83 mmHg  Pulse 88  Temp(Src) 98.1 F (36.7 C) (Oral)  Resp 16  Ht 5\' 8"  (1.727 m)  Wt 173 lb 9.6 oz (78.744 kg)  BMI 26.40 kg/m2  SpO2 96%  Physical Exam  Constitutional: He is oriented to person, place, and time. Vital signs are normal. He appears well-developed and well-nourished.  Non-toxic appearance. No distress.  Afebrile, nontoxic, NAD  HENT:  Head: Normocephalic and atraumatic.  Mouth/Throat: Oropharynx is clear and moist and mucous membranes are normal.  Eyes: Conjunctivae and EOM are normal. Right eye exhibits no discharge. Left eye exhibits no discharge.  Neck: Normal range of motion. Neck supple.  Cardiovascular: Normal rate, regular rhythm, normal heart sounds and intact distal pulses.  Exam reveals no gallop and no friction rub.   No murmur heard. Pulmonary/Chest: Effort  normal and breath sounds normal. No respiratory distress. He has no decreased breath sounds. He has no wheezes. He has no rhonchi. He has no rales.  Abdominal: Soft. Normal appearance and bowel sounds are normal. He exhibits no distension. There is no tenderness. There is no rigidity, no rebound, no guarding, no CVA tenderness, no tenderness at McBurney's point and negative Murphy's sign. Hernia confirmed negative in the right inguinal area and confirmed negative in the left inguinal area.  Soft, NTND, +BS throughout, no r/g/r, neg murphy's, neg mcburney's, no CVA TTP   Genitourinary: Testes normal. Cremasteric reflex is present. Right testis shows no mass, no swelling and no tenderness. Left testis shows no mass, no swelling and no tenderness. Circumcised. No phimosis, paraphimosis, hypospadias, penile erythema or penile tenderness. No discharge found.  Chaperone present for exam Circumcised penis without phimosis/paraphimosis, hypospadias, erythema, tenderness, or discharge. Testes with no masses or tenderness, no swelling, and cremasterics reflex present bilaterally. No inguinal hernias or adenopathy present.   Musculoskeletal: Normal range of motion.       Lumbar back: He exhibits tenderness and spasm. He exhibits normal range of motion, no bony tenderness and no deformity.       Back:  Lumbar spine with FROM intact without spinous process TTP, no bony stepoffs or deformities, mild L sided paraspinous muscle TTP with palpable muscle spasms. Strength 5/5  in all extremities, sensation grossly intact in all extremities, negative SLR bilaterally, gait steady and nonantalgic. No overlying skin changes.   Neurological: He is alert and oriented to person, place, and time. He has normal strength. No sensory deficit.  Skin: Skin is warm, dry and intact. No rash noted.  Psychiatric: He has a normal mood and affect.  Nursing note and vitals reviewed.   ED Course  Procedures (including critical care  time) Labs Review Labs Reviewed  URINALYSIS, ROUTINE W REFLEX MICROSCOPIC (NOT AT [facility]) - Abnormal; Notable for the following:    APPearance CLOUDY (*)    Specific Gravity, Urine >1.030 (*)    Hgb urine dipstick LARGE (*)    Protein, ur 30 (*)    Leukocytes, UA TRACE (*)    All other components within normal limits  CBC WITH DIFFERENTIAL/PLATELET - Abnormal; Notable for the following:    Hemoglobin 17.6 (*)    All other components within normal limits  BASIC METABOLIC PANEL - Abnormal; Notable for the following:    Glucose, Bld 112 (*)    BUN 22 (*)    All other components within normal limits  URINE MICROSCOPIC-ADD ON  RPR  HIV ANTIBODY (ROUTINE TESTING)  GC/CHLAMYDIA PROBE AMP ([place]) NOT AT [facility]    Imaging Review No results found. I have personally reviewed and evaluated these images and lab results as part of my medical decision-making.   EKG Interpretation None      MDM   Final diagnoses:  Chronic back pain  Hematuria  Groin pain, left  Back muscle spasm    39 y.o. male here with low back pain that occasionally radiates to L groin/testicle. Occasionally he is unable to achieve ejaculation, but states this morning he was successful, and that he has no issues with achieving erection. No red flag s/sx for back pain, and no midline spinal tenderness. Only tenderness is to L paraspinous muscles, mild spasm noted. On GU exam, no testicular tenderness or swelling, cremasterics present, no hernias palpated, doubt torsion/epididymitis/orchitis/hydrocele/etc. Doubt need for imaging. Discussed that this could be a kidney stone since he states he's had a history of that in the past, but this feels less severe and isn't making him nauseated. Will get basic labs to assess kidney function, U/A, and STD check. Nothing on exam concerning for STDs, but will reassess after urinalysis is back. Doubt need for CT stone search at this time. Will give pain meds and reassess.  5:56  PM Pt declined pain med, therefore he wasn't given the vicodin ordered. U/A with 11-20 WBC, trace leuks, 11-20 RBCs. Will treat empirically for GC/CT given the symptoms and the WBCs. CBC w/diff unremarkable. BMP WNL aside from BUN 22 but normal Cr. Discussed that his urine could indicate that his pain is from nephrolithiasis but the location and description of symptoms doesn't seem consistent, and I doubt need for CT scan at this point. Discussed good hydration and low-purine diet. Tylenol/motrin for pain as this seems more musculoskeletal, and heat to areas of pain. F/up with PCP in 1wk for recheck. I explained the diagnosis and have given explicit precautions to return to the ER including for any other new or worsening symptoms. The patient understands and accepts the medical plan as it's been dictated and I have answered their questions. Discharge instructions concerning home care and prescriptions have been given. The patient is STABLE and is discharged to home in good condition.  BP 117/75 mmHg  Pulse 69  Temp(Src) 98.1  F (36.7 C) (Oral)  Resp 16  Ht 5\' 8"  (1.727 m)  Wt 173 lb 9.6 oz (78.744 kg)  BMI 26.40 kg/m2  SpO2 97%  Meds ordered this encounter  Medications  . HYDROcodone-acetaminophen (NORCO/VICODIN) 5-325 MG per tablet 1 tablet    Sig:   . azithromycin (ZITHROMAX) tablet 1,000 mg    Sig:    And  . cefTRIAXone (ROCEPHIN) injection 250 mg    Sig:     Order Specific Question:  Antibiotic Indication:    Answer:  STD     Dessire Grimes Camprubi-Soms, PA-C 08/07/15 1808  Merrily Pew, MD 08/10/15 2218

## 2015-08-07 NOTE — Discharge Instructions (Signed)
Your pain could be related to a pulled muscle in your back. Use tylenol/motrin as needed for pain, and use heat to the areas of pain as needed to help with pain. Stay well hydrated. You urine showed some signs of blood, so this pain could be related to kidney stone, follow the purine diet described below to try to avoid future kidney stones. You have been treated for gonorrhea and chlamydia in the ER but the hospital will call you if lab is positive. You were tested for HIV and Syphilis, and the hospital will call you if the lab is positive. Avoid sexual intercourse until you know whether your results are positive or not. Follow up with your regular doctor in 1 week for recheck of symptoms. Return to the ER for changes or worsening symptoms.   Back Pain, Adult Back pain is very common. The pain often gets better over time. The cause of back pain is usually not dangerous. Most people can learn to manage their back pain on their own.  HOME CARE  Watch your back pain for any changes. The following actions may help to lessen any pain you are feeling:  Stay active. Start with short walks on flat ground if you can. Try to walk farther each day.  Exercise regularly as told by your doctor. Exercise helps your back heal faster. It also helps avoid future injury by keeping your muscles strong and flexible.  Do not sit, drive, or stand in one place for more than 30 minutes.  Do not stay in bed. Resting more than 1-2 days can slow down your recovery.  Be careful when you bend or lift an object. Use good form when lifting:  Bend at your knees.  Keep the object close to your body.  Do not twist.  Sleep on a firm mattress. Lie on your side, and bend your knees. If you lie on your back, put a pillow under your knees.  Take medicines only as told by your doctor.  Put ice on the injured area.  Put ice in a plastic bag.  Place a towel between your skin and the bag.  Leave the ice on for 20 minutes,  2-3 times a day for the first 2-3 days. After that, you can switch between ice and heat packs.  Avoid feeling anxious or stressed. Find good ways to deal with stress, such as exercise.  Maintain a healthy weight. Extra weight puts stress on your back. GET HELP IF:   You have pain that does not go away with rest or medicine.  You have worsening pain that goes down into your legs or buttocks.  You have pain that does not get better in one week.  You have pain at night.  You lose weight.  You have a fever or chills. GET HELP RIGHT AWAY IF:   You cannot control when you poop (bowel movement) or pee (urinate).  Your arms or legs feel weak.  Your arms or legs lose feeling (numbness).  You feel sick to your stomach (nauseous) or throw up (vomit).  You have belly (abdominal) pain.  You feel like you may pass out (faint).   This information is not intended to replace advice given to you by your health care provider. Make sure you discuss any questions you have with your health care provider.   Document Released: 04/02/2008 Document Revised: 11/05/2014 Document Reviewed: 02/16/2014 Elsevier Interactive Patient Education 2016 [place] Injury Prevention Back injuries can be very  painful. They can also be difficult to heal. After having one back injury, you are more likely to injure your back again. It is important to learn how to avoid injuring or re-injuring your back. The following tips can help you to prevent a back injury. WHAT SHOULD I KNOW ABOUT PHYSICAL FITNESS?  Exercise for 30 minutes per day on most days of the week or as told by your doctor. Make sure to:  Do aerobic exercises, such as walking, jogging, biking, or swimming.  Do exercises that increase balance and strength, such as tai chi and yoga.  Do stretching exercises. This helps with flexibility.  Try to develop strong belly (abdominal) muscles. Your belly muscles help to support your back.  Stay at  a healthy weight. This helps to decrease your risk of a back injury. WHAT SHOULD I KNOW ABOUT MY DIET?  Talk with your doctor about your overall diet. Take supplements and vitamins only as told by your doctor.  Talk with your doctor about how much calcium and vitamin D you need each day. These nutrients help to prevent weakening of the bones (osteoporosis).  Include good sources of calcium in your diet, such as:  Dairy products.  Green leafy vegetables.  Products that have had calcium added to them (fortified).  Include good sources of vitamin D in your diet, such as:  Milk.  Foods that have had vitamin D added to them. WHAT SHOULD I KNOW ABOUT MY POSTURE?  Sit up straight and stand up straight. Avoid leaning forward when you sit or hunching over when you stand.  Choose chairs that have good low-back (lumbar) support.  If you work at a desk, sit close to it so you do not need to lean over. Keep your chin tucked in. Keep your neck drawn back. Keep your elbows bent so your arms look like the letter "L" (right angle).  Sit high and close to the steering wheel when you drive. Add a low-back support to your car seat, if needed.  Avoid sitting or standing in one position for very long. Take breaks to get up, stretch, and walk around at least one time every hour. Take breaks every hour if you are driving for long periods of time.  Sleep on your side with your knees slightly bent, or sleep on your back with a pillow under your knees. Do not lie on the front of your body to sleep. WHAT SHOULD I KNOW ABOUT LIFTING, TWISTING, AND REACHING Lifting and Heavy Lifting  Avoid heavy lifting, especially lifting over and over again. If you must do heavy lifting:  Stretch before lifting.  Work slowly.  Rest between lifts.  Use a tool such as a cart or a dolly to move objects if one is available.  Make several small trips instead of carrying one heavy load.  Ask for help when you need it,  especially when moving big objects.  Follow these steps when lifting:  Stand with your feet shoulder-width apart.  Get as close to the object as you can. Do not pick up a heavy object that is far from your body.  Use handles or lifting straps if they are available.  Bend at your knees. Squat down, but keep your heels off the floor.  Keep your shoulders back. Keep your chin tucked in. Keep your back straight.  Lift the object slowly while you tighten the muscles in your legs, belly, and butt. Keep the object as close to the center of your  body as possible.  Follow these steps when putting down a heavy load:  Stand with your feet shoulder-width apart.  Lower the object slowly while you tighten the muscles in your legs, belly, and butt. Keep the object as close to the center of your body as possible.  Keep your shoulders back. Keep your chin tucked in. Keep your back straight.  Bend at your knees. Squat down, but keep your heels off the floor.  Use handles or lifting straps if they are available. Twisting and Reaching  Avoid lifting heavy objects above your waist.  Do not twist at your waist while you are lifting or carrying a load. If you need to turn, move your feet.  Do not bend over without bending at your knees.  Avoid reaching over your head, across a table, or for an object on a high surface.  WHAT ARE SOME OTHER TIPS? 1. Avoid wet floors and icy ground. Keep sidewalks clear of ice to prevent falls.  2. Do not sleep on a mattress that is too soft or too hard.  3. Keep items that you use often within easy reach.  4. Put heavier objects on shelves at waist level, and put lighter objects on lower or higher shelves. 5. Find ways to lower your stress, such as: 1. Exercise. 2. Massage. 3. Relaxation techniques. 6. Talk with your doctor if you feel anxious or depressed. These conditions can make back pain worse. 7. Wear flat heel shoes with cushioned soles. 8. Avoid  making quick (sudden) movements. 9. Use both shoulder straps when carrying a backpack. 10. Do not use any tobacco products, including cigarettes, chewing tobacco, or electronic cigarettes. If you need help quitting, ask your doctor.   This information is not intended to replace advice given to you by your health care provider. Make sure you discuss any questions you have with your health care provider.   Document Released: 04/02/2008 Document Revised: 03/01/2015 Document Reviewed: 10/19/2014 Elsevier Interactive Patient Education 2016 [place].  Back Exercises If you have pain in your back, do these exercises 2-3 times each day or as told by your doctor. When the pain goes away, do the exercises once each day, but repeat the steps more times for each exercise (do more repetitions). If you do not have pain in your back, do these exercises once each day or as told by your doctor. EXERCISES Single Knee to Chest Do these steps 3-5 times in a row for each leg:  Lie on your back on a firm bed or the floor with your legs stretched out.  Bring one knee to your chest.  Hold your knee to your chest by grabbing your knee or thigh.  Pull on your knee until you feel a gentle stretch in your lower back.  Keep doing the stretch for 10-30 seconds.  Slowly let go of your leg and straighten it. Pelvic Tilt Do these steps 5-10 times in a row:  Lie on your back on a firm bed or the floor with your legs stretched out.  Bend your knees so they point up to the ceiling. Your feet should be flat on the floor.  Tighten your lower belly (abdomen) muscles to press your lower back against the floor. This will make your tailbone point up to the ceiling instead of pointing down to your feet or the floor.  Stay in this position for 5-10 seconds while you gently tighten your muscles and breathe evenly. Cat-Cow Do these steps until your  lower back bends more easily:  Get on your hands and knees on a firm  surface. Keep your hands under your shoulders, and keep your knees under your hips. You may put padding under your knees.  Let your head hang down, and make your tailbone point down to the floor so your lower back is round like the back of a cat.  Stay in this position for 5 seconds.  Slowly lift your head and make your tailbone point up to the ceiling so your back hangs low (sags) like the back of a cow.  Stay in this position for 5 seconds. Press-Ups Do these steps 5-10 times in a row:  Lie on your belly (face-down) on the floor.  Place your hands near your head, about shoulder-width apart.  While you keep your back relaxed and keep your hips on the floor, slowly straighten your arms to raise the top half of your body and lift your shoulders. Do not use your back muscles. To make yourself more comfortable, you may change where you place your hands.  Stay in this position for 5 seconds.  Slowly return to lying flat on the floor. Bridges Do these steps 10 times in a row:  Lie on your back on a firm surface.  Bend your knees so they point up to the ceiling. Your feet should be flat on the floor.  Tighten your butt muscles and lift your butt off of the floor until your waist is almost as high as your knees. If you do not feel the muscles working in your butt and the back of your thighs, slide your feet 1-2 inches farther away from your butt.  Stay in this position for 3-5 seconds.  Slowly lower your butt to the floor, and let your butt muscles relax. If this exercise is too easy, try doing it with your arms crossed over your chest. Belly Crunches Do these steps 5-10 times in a row: 11. Lie on your back on a firm bed or the floor with your legs stretched out. 12. Bend your knees so they point up to the ceiling. Your feet should be flat on the floor. 67. Cross your arms over your chest. 14. Tip your chin a little bit toward your chest but do not bend your neck. 39. Tighten your  belly muscles and slowly raise your chest just enough to lift your shoulder blades a tiny bit off of the floor. 16. Slowly lower your chest and your head to the floor. Back Lifts Do these steps 5-10 times in a row: 1. Lie on your belly (face-down) with your arms at your sides, and rest your forehead on the floor. 2. Tighten the muscles in your legs and your butt. 3. Slowly lift your chest off of the floor while you keep your hips on the floor. Keep the back of your head in line with the curve in your back. Look at the floor while you do this. 4. Stay in this position for 3-5 seconds. 5. Slowly lower your chest and your face to the floor. GET HELP IF:  Your back pain gets a lot worse when you do an exercise.  Your back pain does not lessen 2 hours after you exercise. If you have any of these problems, stop doing the exercises. Do not do them again unless your doctor says it is okay. GET HELP RIGHT AWAY IF:  You have sudden, very bad back pain. If this happens, stop doing the exercises. Do not  do them again unless your doctor says it is okay.   This information is not intended to replace advice given to you by your health care provider. Make sure you discuss any questions you have with your health care provider.   Document Released: 11/17/2010 Document Revised: 07/06/2015 Document Reviewed: 12/09/2014 Elsevier Interactive Patient Education 2016 [place] therapy can help ease sore, stiff, injured, and tight muscles and joints. Heat relaxes your muscles, which may help ease your pain. Heat therapy should only be used on old, pre-existing, or long-lasting (chronic) injuries. Do not use heat therapy unless told by your doctor. HOW TO USE HEAT THERAPY There are several different kinds of heat therapy, including:  Moist heat pack.  Warm water bath.  Hot water bottle.  Electric heating pad.  Heated gel pack.  Heated wrap.  Electric heating pad. GENERAL HEAT  THERAPY RECOMMENDATIONS   Do not sleep while using heat therapy. Only use heat therapy while you are awake.  Your skin may turn pink while using heat therapy. Do not use heat therapy if your skin turns red.  Do not use heat therapy if you have new pain.  High heat or long exposure to heat can cause burns. Be careful when using heat therapy to avoid burning your skin.  Do not use heat therapy on areas of your skin that are already irritated, such as with a rash or sunburn. GET HELP IF:   You have blisters, redness, swelling (puffiness), or numbness.  You have new pain.  Your pain is worse. MAKE SURE YOU:  Understand these instructions.  Will watch your condition.  Will get help right away if you are not doing well or get worse.   This information is not intended to replace advice given to you by your health care provider. Make sure you discuss any questions you have with your health care provider.   Document Released: 01/07/2012 Document Revised: 11/05/2014 Document Reviewed: 12/08/2013 Elsevier Interactive Patient Education 2016 [employer]  Muscle Cramps and Spasms Muscle cramps and spasms occur when a muscle or muscles tighten and you have no control over this tightening (involuntary muscle contraction). They are a common problem and can develop in any muscle. The most common place is in the calf muscles of the leg. Both muscle cramps and muscle spasms are involuntary muscle contractions, but they also have differences:   Muscle cramps are sporadic and painful. They may last a few seconds to a quarter of an hour. Muscle cramps are often more forceful and last longer than muscle spasms.  Muscle spasms may or may not be painful. They may also last just a few seconds or much longer. CAUSES  It is uncommon for cramps or spasms to be due to a serious underlying problem. In many cases, the cause of cramps or spasms is unknown. Some common causes are:   Overexertion.    Overuse from repetitive motions (doing the same thing over and over).   Remaining in a certain position for a long period of time.   Improper preparation, form, or technique while performing a sport or activity.   Dehydration.   Injury.   Side effects of some medicines.   Abnormally low levels of the salts and ions in your blood (electrolytes), especially potassium and calcium. This could happen if you are taking water pills (diuretics) or you are pregnant.  Some underlying medical problems can make it more likely to develop cramps or spasms. These include, but  are not limited to:   Diabetes.   Parkinson disease.   Hormone disorders, such as thyroid problems.   Alcohol abuse.   Diseases specific to muscles, joints, and bones.   Blood vessel disease where not enough blood is getting to the muscles.  HOME CARE INSTRUCTIONS   Stay well hydrated. Drink enough water and fluids to keep your urine clear or pale yellow.  It may be helpful to massage, stretch, and relax the affected muscle.  For tight or tense muscles, use a warm towel, heating pad, or hot shower water directed to the affected area.  If you are sore or have pain after a cramp or spasm, applying ice to the affected area may relieve discomfort.  Put ice in a plastic bag.  Place a towel between your skin and the bag.  Leave the ice on for 15-20 minutes, 03-04 times a day.  Medicines used to treat a known cause of cramps or spasms may help reduce their frequency or severity. Only take over-the-counter or prescription medicines as directed by your caregiver. SEEK MEDICAL CARE IF:  Your cramps or spasms get more severe, more frequent, or do not improve over time.  MAKE SURE YOU:   Understand these instructions.  Will watch your condition.  Will get help right away if you are not doing well or get worse.   This information is not intended to replace advice given to you by your health care  provider. Make sure you discuss any questions you have with your health care provider.   Document Released: 04/06/2002 Document Revised: 02/09/2013 Document Reviewed: 10/01/2012 Elsevier Interactive Patient Education [employer].

## 2015-08-07 NOTE — ED Notes (Signed)
Pt reports decreased sexual function two times with wife and he is concerned about this. Patient is wondering if this is due to his chronic back pain.

## 2015-08-08 LAB — RPR: RPR Ser Ql: NONREACTIVE

## 2015-08-08 LAB — GC/CHLAMYDIA PROBE AMP (~~LOC~~) NOT AT ARMC
Chlamydia: NEGATIVE
NEISSERIA GONORRHEA: NEGATIVE

## 2015-08-08 LAB — HIV ANTIBODY (ROUTINE TESTING W REFLEX): HIV Screen 4th Generation wRfx: NONREACTIVE

## 2015-08-09 LAB — URINE CULTURE

## 2015-12-30 ENCOUNTER — Emergency Department (HOSPITAL_COMMUNITY): Payer: Medicare Other

## 2015-12-30 ENCOUNTER — Emergency Department (HOSPITAL_COMMUNITY)
Admission: EM | Admit: 2015-12-30 | Discharge: 2015-12-30 | Disposition: A | Discharge status: Home / Self Care | Payer: Medicare Other | Attending: Emergency Medicine | Admitting: Emergency Medicine

## 2015-12-30 ENCOUNTER — Encounter (HOSPITAL_COMMUNITY): Payer: Self-pay | Admitting: Emergency Medicine

## 2015-12-30 DIAGNOSIS — N200 Calculus of kidney: Secondary | ICD-10-CM | POA: Diagnosis not present

## 2015-12-30 DIAGNOSIS — N390 Urinary tract infection, site not specified: Secondary | ICD-10-CM

## 2015-12-30 DIAGNOSIS — Z79899 Other long term (current) drug therapy: Secondary | ICD-10-CM | POA: Diagnosis not present

## 2015-12-30 DIAGNOSIS — E785 Hyperlipidemia, unspecified: Secondary | ICD-10-CM | POA: Diagnosis not present

## 2015-12-30 DIAGNOSIS — F17210 Nicotine dependence, cigarettes, uncomplicated: Secondary | ICD-10-CM | POA: Diagnosis not present

## 2015-12-30 DIAGNOSIS — R109 Unspecified abdominal pain: Secondary | ICD-10-CM | POA: Diagnosis present

## 2015-12-30 LAB — URINALYSIS, ROUTINE W REFLEX MICROSCOPIC
BILIRUBIN URINE: NEGATIVE
GLUCOSE, UA: NEGATIVE mg/dL
Ketones, ur: NEGATIVE mg/dL
Nitrite: NEGATIVE
Protein, ur: 100 mg/dL — AB
SPECIFIC GRAVITY, URINE: 1.024 (ref 1.005–1.030)
pH: 5.5 (ref 5.0–8.0)

## 2015-12-30 LAB — BASIC METABOLIC PANEL
Anion gap: 12 (ref 5–15)
BUN: 19 mg/dL (ref 6–20)
CHLORIDE: 105 mmol/L (ref 101–111)
CO2: 23 mmol/L (ref 22–32)
CREATININE: 1.24 mg/dL (ref 0.61–1.24)
Calcium: 9.3 mg/dL (ref 8.9–10.3)
GFR calc Af Amer: >60 mL/min (ref >60)
GFR calc non Af Amer: >60 mL/min (ref >60)
GLUCOSE: 107 mg/dL — AB (ref 65–99)
Potassium: 4.2 mmol/L (ref 3.5–5.1)
SODIUM: 140 mmol/L (ref 135–145)

## 2015-12-30 LAB — CBC
HEMATOCRIT: 52.1 % — AB (ref 39.0–52.0)
Hemoglobin: 18.4 g/dL — ABNORMAL HIGH (ref 13.0–17.0)
MCH: 32.3 pg (ref 26.0–34.0)
MCHC: 35.3 g/dL (ref 30.0–36.0)
MCV: 91.4 fL (ref 78.0–100.0)
PLATELETS: 228 10*3/uL (ref 150–400)
RBC: 5.7 MIL/uL (ref 4.22–5.81)
RDW: 13.2 % (ref 11.5–15.5)
WBC: 5.8 10*3/uL (ref 4.0–10.5)

## 2015-12-30 LAB — URINE MICROSCOPIC-ADD ON

## 2015-12-30 MED ORDER — HYDROCODONE-ACETAMINOPHEN 5-325 MG PO TABS: 1.0000 | ORAL_TABLET | Freq: Four times a day (QID) | ORAL | Status: DC | PRN

## 2015-12-30 MED ORDER — SULFAMETHOXAZOLE-TRIMETHOPRIM 800-160 MG PO TABS: 1.0000 | ORAL_TABLET | Freq: Two times a day (BID) | ORAL | Status: AC

## 2015-12-30 MED ORDER — OXYCODONE-ACETAMINOPHEN 5-325 MG PO TABS
1.0000 | ORAL_TABLET | Freq: Four times a day (QID) | ORAL | Status: DC | PRN
Start: 2015-12-30 — End: 2016-01-12

## 2015-12-30 MED ORDER — DEXTROSE 5 % IV SOLN
1.0000 g | Freq: Once | INTRAVENOUS | Status: AC
  Administered 2015-12-30: 1 g via INTRAVENOUS
  Filled 2015-12-30: qty 10

## 2015-12-30 MED ORDER — KETOROLAC TROMETHAMINE 30 MG/ML IJ SOLN
30.0000 mg | Freq: Once | INTRAMUSCULAR | Status: AC
  Administered 2015-12-30: 30 mg via INTRAVENOUS
  Filled 2015-12-30: qty 1

## 2015-12-30 MED ORDER — FENTANYL CITRATE (PF) 100 MCG/2ML IJ SOLN
50.0000 ug | Freq: Once | INTRAMUSCULAR | Status: AC
  Administered 2015-12-30: 50 ug via INTRAVENOUS
  Filled 2015-12-30: qty 2

## 2015-12-30 MED ORDER — SULFAMETHOXAZOLE-TRIMETHOPRIM 800-160 MG PO TABS: 1.0000 | ORAL_TABLET | Freq: Two times a day (BID) | ORAL | Status: DC

## 2015-12-30 MED ORDER — ONDANSETRON HCL 4 MG/2ML IJ SOLN
4.0000 mg | Freq: Once | INTRAMUSCULAR | Status: AC
  Administered 2015-12-30: 4 mg via INTRAVENOUS
  Filled 2015-12-30: qty 2

## 2015-12-30 MED ORDER — TAMSULOSIN HCL 0.4 MG PO CAPS: 0.4000 mg | ORAL_CAPSULE | Freq: Every day | ORAL | Status: DC

## 2015-12-30 NOTE — ED Notes (Signed)
Pt sleeping and woke up to severe flank pain that radiates down the back. Pt has Hx of kidney stones and says this is identical to when he's had stones in the past. Reports pain with urination as well since onset, no blood noted in urine.

## 2015-12-30 NOTE — Discharge Instructions (Signed)
You have a 31mm kidney stone on the right side that is causing an obstruction.  You also have a 68mm kidney stone on the left kidney without obstruction.  You have a urinary tract infection.  Please follow up with urologist next week for further care if your condition is not improving.  Take antibiotic and pain medication as prescribed. Avoid drinking tea or alcohol.   Kidney Stones Kidney stones (urolithiasis) are deposits that form inside your kidneys. The intense pain is caused by the stone moving through the urinary tract. When the stone moves, the ureter goes into spasm around the stone. The stone is usually passed in the urine.  CAUSES   A disorder that makes certain neck glands produce too much parathyroid hormone (primary hyperparathyroidism).  A buildup of uric acid crystals, similar to gout in your joints.  Narrowing (stricture) of the ureter.  A kidney obstruction present at birth (congenital obstruction).  Previous surgery on the kidney or ureters.  Numerous kidney infections. SYMPTOMS   Feeling sick to your stomach (nauseous).  Throwing up (vomiting).  Blood in the urine (hematuria).  Pain that usually spreads (radiates) to the groin.  Frequency or urgency of urination. DIAGNOSIS   Taking a history and physical exam.  Blood or urine tests.  CT scan.  Occasionally, an examination of the inside of the urinary bladder (cystoscopy) is performed. TREATMENT   Observation.  Increasing your fluid intake.  Extracorporeal shock wave lithotripsy--This is a noninvasive procedure that uses shock waves to break up kidney stones.  Surgery may be needed if you have severe pain or persistent obstruction. There are various surgical procedures. Most of the procedures are performed with the use of small instruments. Only small incisions are needed to accommodate these instruments, so recovery time is minimized. The size, location, and chemical composition are all important  variables that will determine the proper choice of action for you. Talk to your health care provider to better understand your situation so that you will minimize the risk of injury to yourself and your kidney.  HOME CARE INSTRUCTIONS   Drink enough water and fluids to keep your urine clear or pale yellow. This will help you to pass the stone or stone fragments.  Strain all urine through the provided strainer. Keep all particulate matter and stones for your health care provider to see. The stone causing the pain may be as small as a grain of salt. It is very important to use the strainer each and every time you pass your urine. The collection of your stone will allow your health care provider to analyze it and verify that a stone has actually passed. The stone analysis will often identify what you can do to reduce the incidence of recurrences.  Only take over-the-counter or prescription medicines for pain, discomfort, or fever as directed by your health care provider.  Keep all follow-up visits as told by your health care provider. This is important.  Get follow-up X-rays if required. The absence of pain does not always mean that the stone has passed. It may have only stopped moving. If the urine remains completely obstructed, it can cause loss of kidney function or even complete destruction of the kidney. It is your responsibility to make sure X-rays and follow-ups are completed. Ultrasounds of the kidney can show blockages and the status of the kidney. Ultrasounds are not associated with any radiation and can be performed easily in a matter of minutes.  Make changes to your daily diet  as told by your health care provider. You may be told to:  Limit the amount of salt that you eat.  Eat 5 or more servings of fruits and vegetables each day.  Limit the amount of meat, poultry, fish, and eggs that you eat.  Collect a 24-hour urine sample as told by your health care provider.You may need to  collect another urine sample every 6-12 months. SEEK MEDICAL CARE IF:  You experience pain that is progressive and unresponsive to any pain medicine you have been prescribed. SEEK IMMEDIATE MEDICAL CARE IF:   Pain cannot be controlled with the prescribed medicine.  You have a fever or shaking chills.  The severity or intensity of pain increases over 18 hours and is not relieved by pain medicine.  You develop a new onset of abdominal pain.  You feel faint or pass out.  You are unable to urinate.   This information is not intended to replace advice given to you by your health care provider. Make sure you discuss any questions you have with your health care provider.   Document Released: 10/15/2005 Document Revised: 07/06/2015 Document Reviewed: 03/18/2013 Elsevier Interactive Patient Education [employer].

## 2015-12-30 NOTE — ED Provider Notes (Signed)
CSN: [id number]     Arrival date & time 12/30/15  [id number] History   First MD Initiated Contact with Patient 12/30/15 [phone]     Chief Complaint  Patient presents with  . Flank Pain     (Consider location/radiation/quality/duration/timing/severity/associated sxs/prior Treatment) HPI   40 year old male with history of kidney stones who presents with complaints of flank pain. Patient report acute onset of sharp stabbing right flank pain that started early this morning which woke him up from sleep. Pain seems to be radiating down his back and felt similar to prior kidney stones. The pain was intense, he felt nauseous and vomited once. Pain initially was severe but now he rates as a 5 out of 10 after receiving fentanyl in the ED. This no associated fever, abdominal pain, testicular pain, dysuria, hematuria, or rash. He denies any recent injury. He reports similar pain to both of his flanks which has been waxing waning for the past 4 months in which she has been seen by his PCP but was told that is likely muscle related. No prior history of lithotripsy or renal stenting.  Past Medical History  Diagnosis Date  . Hyperlipemia    History reviewed. No pertinent past surgical history. History reviewed. No pertinent family history. Social History  Substance Use Topics  . Smoking status: Current Every Day Smoker    Types: Cigarettes  . Smokeless tobacco: None  . Alcohol Use: Yes    Review of Systems  All other systems reviewed and are negative.     Allergies  Aspirin and Diphenhydramine  Home Medications   Prior to Admission medications   Medication Sig Start Date End Date Taking? Authorizing Provider  ibuprofen (ADVIL,MOTRIN) 200 MG tablet Take 800 mg by mouth every 6 (six) hours as needed for moderate pain.   Yes Historical Provider, MD  pravastatin (PRAVACHOL) 20 MG tablet Take 20 mg by mouth daily.   Yes Historical Provider, MD   BP 152/97 mmHg  Pulse 68  Temp(Src) 98 F (36.7 C)  (Oral)  Resp 16  SpO2 99% Physical Exam  Constitutional: He appears well-developed and well-nourished. No distress.  HENT:  Head: Atraumatic.  Eyes: Conjunctivae are normal.  Neck: Neck supple.  Cardiovascular: Normal rate and regular rhythm.   Pulmonary/Chest: Effort normal and breath sounds normal.  Abdominal: Soft. There is no tenderness.  Genitourinary:  Right CVA tenderness on percussion  Neurological: He is alert.  Skin: No rash noted.  Psychiatric: He has a normal mood and affect.  Nursing note and vitals reviewed.   ED Course  Procedures (including critical care time) Labs Review Labs Reviewed  URINALYSIS, ROUTINE W REFLEX MICROSCOPIC (NOT AT [facility]) - Abnormal; Notable for the following:    Color, Urine AMBER (*)    APPearance CLOUDY (*)    Hgb urine dipstick LARGE (*)    Protein, ur 100 (*)    Leukocytes, UA MODERATE (*)    All other components within normal limits  BASIC METABOLIC PANEL - Abnormal; Notable for the following:    Glucose, Bld 107 (*)    All other components within normal limits  CBC - Abnormal; Notable for the following:    Hemoglobin 18.4 (*)    HCT 52.1 (*)    All other components within normal limits  URINE MICROSCOPIC-ADD ON - Abnormal; Notable for the following:    Squamous Epithelial / LPF 0-5 (*)    Bacteria, UA RARE (*)    All other components within normal limits  Imaging Review Ct Renal Stone Study  12/30/2015  CLINICAL DATA:  Right flank pain EXAM: CT ABDOMEN AND PELVIS WITHOUT CONTRAST TECHNIQUE: Multidetector CT imaging of the abdomen and pelvis was performed following the standard protocol without oral or intravenous contrast material administration. COMPARISON:  June 29, 2012 FINDINGS: Lower chest:  There is patchy bibasilar lung atelectasis. Hepatobiliary: No focal liver lesions are identified on this noncontrast enhanced study. The gallbladder wall is not appreciably thickened. There is no biliary duct dilatation. Pancreas:  There is no pancreatic mass or inflammatory focus. Spleen: No splenic lesions are evident. Adrenals/Urinary Tract: Adrenals appear normal bilaterally. No renal mass is apparent on either side. There is no appreciable hydronephrosis on the left. There is a 1.1 x 1.0 cm cyst in the posterior mid left kidney. There is a cyst in the mid to lower pole left kidney measuring 1.2 x 1.2 cm. There is an extrarenal pelvis on the left. Within this extrarenal pelvis on the left, there is a calculus measuring 8 x 8 mm. No calculi are seen in the left ureter. On the right, there is moderate hydronephrosis. There is no intrarenal calculus. There is a cyst in the posterior mid right kidney measuring 1 x 1 cm. There is a 3 x 2 mm calculus at the right ureterovesical junction. No other ureteral calculi are identified on the right. The urinary bladder is largely decompressed. The urinary bladder wall thickness is upper normal given the degree of contraction of the urinary bladder. Stomach/Bowel: There is no bowel wall or mesenteric thickening. There is no appreciable bowel obstruction. No free air or portal venous air. Vascular/Lymphatic: There is no abdominal aortic aneurysm. No vascular lesions are identified on this noncontrast enhanced study. There is no apparent adenopathy in the abdomen or pelvis. Reproductive: Prostate and seminal vesicles appear normal. No pelvic mass or pelvic fluid collection. Other: Appendix appears normal. No ascites or abscess is seen in the abdomen or pelvis. Musculoskeletal: There are no blastic or lytic bone lesions. No intramuscular or abdominal wall lesions apparent. IMPRESSION: 3 x 2 mm calculus right ureterovesical junction with moderate hydronephrosis on the right. 8 x 8 mm calculus in an extrarenal pelvis on the left. No appreciable hydronephrosis on the left. Appendix appears normal.  No bowel obstruction.  No abscess. Electronically Signed   By: Lowella Grip III M.D.   On: 12/30/2015 07:05    I have personally reviewed and evaluated these images and lab results as part of my medical decision-making.   EKG Interpretation None      MDM   Final diagnoses:  Kidney stone on right side  UTI (lower urinary tract infection)    BP 116/80 mmHg  Pulse 59  Temp(Src) 98 F (36.7 C) (Oral)  Resp 16  SpO2 98%   6:18 AM R flank pain suggestive of kidney stone, CT scan and UA ordered.  No abd pain, no cp or sob.  No signs of skin infection. No midline spine tenderness.    7:20 AM Renal stone study demonstrates a 3 x 2 mm ureteral calculus in the right UVJ with moderate hydronephrosis. There is an 8 x 8 mm Mass in the extrarenal pelvis on the left kidney without hydronephrosis. Furthermore, pt has urine that shows signs of urinary tract infection. His renal function is normal. I will give patient a gram of Rocephin while he is in the ED and provide additional pain medication for symptomatic treatment. He will likely benefit from following up closely with  urologist outpatient for further management. Result discussed with patient. He is otherwise well-appearing.  8:30 AM I have consulted with on call urologist Dr. Karsten Ro in regard to pt's kidney stone with signs of UTI.  He recommends abx to go home and outpt f/u.  Pt otherwise is without fever, dysuria or signs of UTI.  His pain is controlled.  He has received rocephin and stable for discharge with bactrim.  Care discussed with Dr. Ralene Bathe.    Domenic Moras, PA-C 12/30/15 [id number]  Ripley Fraise, MD 12/30/15 [phone]

## 2015-12-30 NOTE — ED Notes (Signed)
Patient transported to CT 

## 2016-01-02 ENCOUNTER — Other Ambulatory Visit: Payer: Self-pay | Admitting: Urology

## 2016-01-05 ENCOUNTER — Encounter (HOSPITAL_COMMUNITY): Payer: Self-pay

## 2016-01-05 ENCOUNTER — Emergency Department (HOSPITAL_COMMUNITY): Payer: Medicare Other

## 2016-01-05 ENCOUNTER — Emergency Department (HOSPITAL_COMMUNITY)
Admission: EM | Admit: 2016-01-05 | Discharge: 2016-01-05 | Disposition: A | Discharge status: Home / Self Care | Payer: Medicare Other | Attending: Emergency Medicine | Admitting: Emergency Medicine

## 2016-01-05 DIAGNOSIS — F17210 Nicotine dependence, cigarettes, uncomplicated: Secondary | ICD-10-CM | POA: Insufficient documentation

## 2016-01-05 DIAGNOSIS — N200 Calculus of kidney: Secondary | ICD-10-CM | POA: Insufficient documentation

## 2016-01-05 DIAGNOSIS — Z79899 Other long term (current) drug therapy: Secondary | ICD-10-CM | POA: Insufficient documentation

## 2016-01-05 DIAGNOSIS — E785 Hyperlipidemia, unspecified: Secondary | ICD-10-CM | POA: Diagnosis not present

## 2016-01-05 DIAGNOSIS — R109 Unspecified abdominal pain: Secondary | ICD-10-CM | POA: Diagnosis present

## 2016-01-05 LAB — I-STAT CHEM 8, ED
BUN: 20 mg/dL (ref 6–20)
CALCIUM ION: 1.2 mmol/L (ref 1.12–1.23)
Chloride: 101 mmol/L (ref 101–111)
Creatinine, Ser: 1 mg/dL (ref 0.61–1.24)
GLUCOSE: 80 mg/dL (ref 65–99)
HCT: 55 % — ABNORMAL HIGH (ref 39.0–52.0)
HEMOGLOBIN: 18.7 g/dL — AB (ref 13.0–17.0)
Potassium: 3.9 mmol/L (ref 3.5–5.1)
SODIUM: 140 mmol/L (ref 135–145)
TCO2: 25 mmol/L (ref 0–100)

## 2016-01-05 LAB — CBC WITH DIFFERENTIAL/PLATELET
BASOS ABS: 0 10*3/uL (ref 0.0–0.1)
BASOS PCT: 0 %
Eosinophils Absolute: 0.1 10*3/uL (ref 0.0–0.7)
Eosinophils Relative: 2 %
HEMATOCRIT: 51.9 % (ref 39.0–52.0)
HEMOGLOBIN: 17.6 g/dL — AB (ref 13.0–17.0)
Lymphocytes Relative: 37 %
Lymphs Abs: 2.5 10*3/uL (ref 0.7–4.0)
MCH: 31.9 pg (ref 26.0–34.0)
MCHC: 33.9 g/dL (ref 30.0–36.0)
MCV: 94.2 fL (ref 78.0–100.0)
Monocytes Absolute: 0.5 10*3/uL (ref 0.1–1.0)
Monocytes Relative: 8 %
NEUTROS ABS: 3.6 10*3/uL (ref 1.7–7.7)
NEUTROS PCT: 53 %
PLATELETS: 192 10*3/uL (ref 150–400)
RBC: 5.51 MIL/uL (ref 4.22–5.81)
RDW: 13.1 % (ref 11.5–15.5)
WBC: 6.8 10*3/uL (ref 4.0–10.5)

## 2016-01-05 LAB — URINALYSIS, ROUTINE W REFLEX MICROSCOPIC
Bilirubin Urine: NEGATIVE
GLUCOSE, UA: NEGATIVE mg/dL
KETONES UR: NEGATIVE mg/dL
Nitrite: NEGATIVE
PH: 6.5 (ref 5.0–8.0)
Protein, ur: 30 mg/dL — AB
Specific Gravity, Urine: 1.018 (ref 1.005–1.030)

## 2016-01-05 LAB — URINE MICROSCOPIC-ADD ON
BACTERIA UA: NONE SEEN
Squamous Epithelial / LPF: NONE SEEN

## 2016-01-05 MED ORDER — KETOROLAC TROMETHAMINE 30 MG/ML IJ SOLN
30.0000 mg | Freq: Once | INTRAMUSCULAR | Status: AC
  Administered 2016-01-05: 30 mg via INTRAMUSCULAR
  Filled 2016-01-05: qty 1

## 2016-01-05 MED ORDER — ONDANSETRON HCL 4 MG/2ML IJ SOLN: 4.0000 mg | Freq: Once | INTRAMUSCULAR | Status: DC

## 2016-01-05 MED ORDER — KETOROLAC TROMETHAMINE 15 MG/ML IJ SOLN: 15.0000 mg | Freq: Once | INTRAMUSCULAR | Status: DC

## 2016-01-05 MED ORDER — OXYCODONE-ACETAMINOPHEN 5-325 MG PO TABS
1.0000 | ORAL_TABLET | Freq: Once | ORAL | Status: AC
  Administered 2016-01-05: 1 via ORAL
  Filled 2016-01-05: qty 1

## 2016-01-05 MED ORDER — ONDANSETRON 4 MG PO TBDP
4.0000 mg | ORAL_TABLET | Freq: Once | ORAL | Status: AC | PRN
Start: 2016-01-05 — End: 2016-01-05
  Administered 2016-01-05: 4 mg via ORAL
  Filled 2016-01-05: qty 1

## 2016-01-05 NOTE — Progress Notes (Addendum)
Entered in d/c instructions  Medicaid [place] Access Covered Patient   [place]: [id number] [place], [place] [address] [email or web address] Use this website to assist with understanding your coverage & to renew application As a Medicaid client you MUST contact DSS/SSI each time you change address, move to another [place] or another state to keep your address updated  [place] Medicaid Transportation to Dr appts if you are have full Medicaid: [phone], [phone]

## 2016-01-05 NOTE — ED Notes (Signed)
PT DISCHARGED. INSTRUCTIONS GIVEN. AAOX3. PT IN NO APPARENT DISTRESS. THE OPPORTUNITY TO ASK QUESTIONS WAS PROVIDED. 

## 2016-01-05 NOTE — ED Notes (Signed)
Explained to pt reason for delay.   

## 2016-01-05 NOTE — ED Provider Notes (Signed)
CSN: [id number]     Arrival date & time 01/05/16  [id number] History   First MD Initiated Contact with Patient 01/05/16 1016     Chief Complaint  Patient presents with  . Flank Pain  . Nausea     (Consider location/radiation/quality/duration/timing/severity/associated sxs/prior Treatment) HPI Comments: Pt comes in with L flank pain. Pt was recently diagnosed with L sided nephrolithiasis, he saw Dr. Karsten Ro 3 days ago and is supposed to get lithotripsy next Thursday. Pt reports that he had severe pain this morning, and it was unbearable, so he decided to come to the ER. There is hx of renal stones.  Patient is a 40 y.o. male presenting with flank pain. The history is provided by the patient.  Flank Pain Pertinent negatives include no chest pain, no abdominal pain and no shortness of breath.    Past Medical History  Diagnosis Date  . Hyperlipemia    History reviewed. No pertinent past surgical history. History reviewed. No pertinent family history. Social History  Substance Use Topics  . Smoking status: Current Every Day Smoker    Types: Cigarettes  . Smokeless tobacco: None  . Alcohol Use: Yes    Review of Systems  Constitutional: Negative for activity change and appetite change.  Respiratory: Negative for cough and shortness of breath.   Cardiovascular: Negative for chest pain.  Gastrointestinal: Negative for abdominal pain.  Genitourinary: Positive for flank pain. Negative for dysuria.      Allergies  Aspirin and Diphenhydramine  Home Medications   Prior to Admission medications   Medication Sig Start Date End Date Taking? Authorizing Provider  albuterol (PROAIR HFA) 108 (90 Base) MCG/ACT inhaler Inhale 2 puffs into the lungs every 6 (six) hours as needed for wheezing.    Yes Historical Provider, MD  omeprazole (PRILOSEC) 20 MG capsule Take 20 mg by mouth daily as needed (acid reflux).  12/15/15  Yes Historical Provider, MD  Oxycodone HCl 10 MG TABS Take 10 mg by mouth  every 6 (six) hours as needed (severe pain).  01/03/16  Yes Historical Provider, MD  oxyCODONE-acetaminophen (PERCOCET/ROXICET) 5-325 MG tablet Take 1 tablet by mouth every 6 (six) hours as needed for severe pain. 12/30/15  Yes Domenic Moras, PA-C  pravastatin (PRAVACHOL) 20 MG tablet Take 20 mg by mouth daily.   Yes Historical Provider, MD  tamsulosin (FLOMAX) 0.4 MG CAPS capsule Take 1 capsule (0.4 mg total) by mouth daily. 12/30/15  Yes Domenic Moras, PA-C  sulfamethoxazole-trimethoprim (BACTRIM DS,SEPTRA DS) 800-160 MG tablet Take 1 tablet by mouth 2 (two) times daily. Patient not taking: Reported on 01/05/2016 12/30/15 01/06/16  Domenic Moras, PA-C   BP 122/78 mmHg  Pulse 62  Temp(Src) 98.4 F (36.9 C) (Oral)  Resp 18  SpO2 97% Physical Exam  Constitutional: He is oriented to person, place, and time. He appears well-developed.  HENT:  Head: Atraumatic.  Neck: Neck supple.  Cardiovascular: Normal rate.   Pulmonary/Chest: Effort normal.  Abdominal: There is tenderness.  L flank and LLQ tenderness  Neurological: He is alert and oriented to person, place, and time.  Skin: Skin is warm.  Nursing note and vitals reviewed.   ED Course  Procedures (including critical care time) Labs Review Labs Reviewed  URINALYSIS, ROUTINE W REFLEX MICROSCOPIC (NOT AT [facility]) - Abnormal; Notable for the following:    Hgb urine dipstick LARGE (*)    Protein, ur 30 (*)    Leukocytes, UA SMALL (*)    All other components within normal limits  CBC WITH DIFFERENTIAL/PLATELET - Abnormal; Notable for the following:    Hemoglobin 17.6 (*)    All other components within normal limits  I-STAT CHEM 8, ED - Abnormal; Notable for the following:    Hemoglobin 18.7 (*)    HCT 55.0 (*)    All other components within normal limits  URINE MICROSCOPIC-ADD ON    Imaging Review US Renal  01/05/2016  CLINICAL DATA:  Left-sided pain for 1 week, history of kidney stones EXAM: RENAL / URINARY TRACT ULTRASOUND COMPLETE COMPARISON:  CT  abdomen and pelvis of 12/30/2015 FINDINGS: Right Kidney: Length: 11.4 cm. No hydronephrosis is seen. The small hypoechoic structures are present consistent with small cysts. The echogenicity of the right kidney may be somewhat increased in correlation with renal laboratory values is recommended. Left Kidney: Length: 11.5 cm. Small left renal cysts also are noted. An extrarenal pelvis is present. The calculus noted by CT within the left renal pelvis is unchanged measuring 1.1 cm. Bladder: The urinary bladder is not well distended but is unremarkable. IMPRESSION: 1. Nonobstructing 11 mm left renal pelvic calculus. 2. Small renal cysts bilaterally. 3. Somewhat echogenic right renal parenchyma. Correlate with renal laboratory values. Electronically Signed   By: Ivar Drape M.D.   On: 01/05/2016 14:15   I have personally reviewed and evaluated these images and lab results as part of my medical decision-making.   EKG Interpretation None      MDM   Final diagnoses:  Nephrolithiasis    Pt with hx of L sided nephrolithiasis comes in with flank pain, L sided. Pt has moderately severe pain. Ultrasound renal ordered. Labs are reassuring.  Post toradol her pain has improved dramatically. Dr. Karsten Ro was made aware of the visit - they will try to squeeze him in on Monday if there are cancellation - otherwise carry on with Thursday lithotripsy. Pt aware of the situation. Happy to go home.     Varney Biles, MD 01/05/16 1540

## 2016-01-05 NOTE — ED Notes (Signed)
Pt ambulated to the BR with steady gait.  Reason for delay explained to pt.

## 2016-01-05 NOTE — ED Notes (Signed)
Pt reports he was seen at [facility] [facility] last Friday for L kidney stones.  Was referred to [facility], is scheduled for surgery next week.  Pt reports pain is unbearable.

## 2016-01-05 NOTE — ED Notes (Signed)
Bed: WA08 Expected date:  Expected time:  Means of arrival:  Comments: 

## 2016-01-05 NOTE — ED Notes (Signed)
Per EMS, Pt, from home, c/o L side flank pain and nausea x 1 week.  Pain score 10/10.  Pt recently diagnosed w/ kidney stones and has lithotripsy scheduled for mid-March.  Pt chose not to take prescribed pain medication this morning and did not call Urologist.

## 2016-01-05 NOTE — ED Notes (Signed)
Pain medication given in Triage. Patient advised about side effects of medications and  to avoid driving for a minimum of 4 hours.  

## 2016-01-05 NOTE — ED Notes (Signed)
Pt ambulated to the BR with steady gait.   

## 2016-01-05 NOTE — Discharge Instructions (Signed)
Kidney Stones °Kidney stones (urolithiasis) are deposits that form inside your kidneys. The intense pain is caused by the stone moving through the urinary tract. When the stone moves, the ureter goes into spasm around the stone. The stone is usually passed in the urine.  °CAUSES  °· A disorder that makes certain neck glands produce too much parathyroid hormone (primary hyperparathyroidism). °· A buildup of uric acid crystals, similar to gout in your joints. °· Narrowing (stricture) of the ureter. °· A kidney obstruction present at birth (congenital obstruction). °· Previous surgery on the kidney or ureters. °· Numerous kidney infections. °SYMPTOMS  °· Feeling sick to your stomach (nauseous). °· Throwing up (vomiting). °· Blood in the urine (hematuria). °· Pain that usually spreads (radiates) to the groin. °· Frequency or urgency of urination. °DIAGNOSIS  °· Taking a history and physical exam. °· Blood or urine tests. °· CT scan. °· Occasionally, an examination of the inside of the urinary bladder (cystoscopy) is performed. °TREATMENT  °· Observation. °· Increasing your fluid intake. °· Extracorporeal shock wave lithotripsy--This is a noninvasive procedure that uses shock waves to break up kidney stones. °· Surgery may be needed if you have severe pain or persistent obstruction. There are various surgical procedures. Most of the procedures are performed with the use of small instruments. Only small incisions are needed to accommodate these instruments, so recovery time is minimized. °The size, location, and chemical composition are all important variables that will determine the proper choice of action for you. Talk to your health care provider to better understand your situation so that you will minimize the risk of injury to yourself and your kidney.  °HOME CARE INSTRUCTIONS  °· Drink enough water and fluids to keep your urine clear or pale yellow. This will help you to pass the stone or stone fragments. °· Strain  all urine through the provided strainer. Keep all particulate matter and stones for your health care provider to see. The stone causing the pain may be as small as a grain of salt. It is very important to use the strainer each and every time you pass your urine. The collection of your stone will allow your health care provider to analyze it and verify that a stone has actually passed. The stone analysis will often identify what you can do to reduce the incidence of recurrences. °· Only take over-the-counter or prescription medicines for pain, discomfort, or fever as directed by your health care provider. °· Keep all follow-up visits as told by your health care provider. This is important. °· Get follow-up X-rays if required. The absence of pain does not always mean that the stone has passed. It may have only stopped moving. If the urine remains completely obstructed, it can cause loss of kidney function or even complete destruction of the kidney. It is your responsibility to make sure X-rays and follow-ups are completed. Ultrasounds of the kidney can show blockages and the status of the kidney. Ultrasounds are not associated with any radiation and can be performed easily in a matter of minutes. °· Make changes to your daily diet as told by your health care provider. You may be told to: °¨ Limit the amount of salt that you eat. °¨ Eat 5 or more servings of fruits and vegetables each day. °¨ Limit the amount of meat, poultry, fish, and eggs that you eat. °· Collect a 24-hour urine sample as told by your health care provider. You may need to collect another urine sample every 6-12   months. °SEEK MEDICAL CARE IF: °· You experience pain that is progressive and unresponsive to any pain medicine you have been prescribed. °SEEK IMMEDIATE MEDICAL CARE IF:  °· Pain cannot be controlled with the prescribed medicine. °· You have a fever or shaking chills. °· The severity or intensity of pain increases over 18 hours and is not  relieved by pain medicine. °· You develop a new onset of abdominal pain. °· You feel faint or pass out. °· You are unable to urinate. °  °This information is not intended to replace advice given to you by your health care provider. Make sure you discuss any questions you have with your health care provider. °  °Document Released: 10/15/2005 Document Revised: 07/06/2015 Document Reviewed: 03/18/2013 °[employer]vier Interactive Patient Education ©2016 [employer] ° °

## 2016-01-10 ENCOUNTER — Encounter (HOSPITAL_COMMUNITY): Payer: Self-pay | Admitting: *Deleted

## 2016-01-11 NOTE — H&P (Signed)
History of Present Illness Matthew Wade is a 40 year old male with a history of calculus disease seen in follow-up from the emergency room for right ureteral stone.    He has a history of stones but has never required any form of surgical intervention having passed 3 stones. He presented to the emergency room on 12/30/15 with acute onset right flank pain that he described as sharp and stabbing in nature. It was severe and radiated into the abdomen with associated nausea and vomiting. A CT scan was obtained which revealed bilateral simple cysts, an extrarenal pelvis on the left hand side with an 8 x 8 mm stone that had Hounsfield units of 1000 but was nonobstructing. On the right there were no renal calculi but a single stone measuring 2.5 mm wide in the area of the intramural ureter with associated mild hydronephrosis.  He said he was having pain on the right hand side but now has developed pain on the left and is not having any right-sided pain. He has no irritative voiding symptoms. The pain on the left-hand side is constant when it occurs but is controlled with oxycodone.   Past Medical History Problems  1. History of hyperlipidemia (Z86.39)  Surgical History Problems  1. History of No Surgical Problems  Current Meds 1. Ibuprofen TABS;  Therapy: (Recorded:06Mar2017) to Recorded 2. Pravastatin Sodium 20 MG Oral Tablet;  Therapy: (Recorded:06Mar2017) to Recorded  Allergies Medication  1. Aspirin TABS 2. diphenhydrAMINE  Family History Problems  1. No pertinent family history : Mother  Social History Problems    Alcohol use (Z78.9)   Current smoker (F17.200)   cigarettes  Review of Systems Genitourinary, constitutional, skin, eye, otolaryngeal, hematologic/lymphatic, cardiovascular, pulmonary, endocrine, musculoskeletal, gastrointestinal, neurological and psychiatric system(s) were reviewed and pertinent findings if present are noted and are otherwise negative.  Genitourinary:  urinary frequency, feelings of urinary urgency, dysuria and hematuria.  Gastrointestinal: flank pain.    Vitals Vital Signs [Data Includes: Last 1 Day]  Recorded: [id number] 11:01AM  Height: 5 ft 8 in Weight: 180 lb  BMI Calculated: 27.37 BSA Calculated: 1.95 Blood Pressure: 138 / 68 Heart Rate: 91  Physical Exam Constitutional: Well nourished and well developed . No acute distress.   ENT:. The ears and nose are normal in appearance.   Neck: The appearance of the neck is normal and no neck mass is present.   Pulmonary: No respiratory distress and normal respiratory rhythm and effort.   Cardiovascular: Heart rate and rhythm are normal . No peripheral edema.   Abdomen: The abdomen is soft and nontender. No masses are palpated. No CVA tenderness. No hernias are palpable. No hepatosplenomegaly noted.   Lymphatics: The femoral and inguinal nodes are not enlarged or tender.   Skin: Normal skin turgor, no visible rash and no visible skin lesions.   Neuro/Psych:. Mood and affect are appropriate.    Results/Data Urine [Data Includes: Last 1 Day]   [id number]  COLOR YELLOW   APPEARANCE CLEAR   SPECIFIC GRAVITY 1.025   pH 6.0   GLUCOSE NEGATIVE   BILIRUBIN NEGATIVE   KETONE NEGATIVE   BLOOD 3+   PROTEIN TRACE   NITRITE NEGATIVE   LEUKOCYTE ESTERASE 1+   SQUAMOUS EPITHELIAL/HPF NONE SEEN HPF  WBC 20-40 WBC/HPF  RBC 20-40 RBC/HPF  BACTERIA FEW HPF  CRYSTALS NONE SEEN HPF  CASTS NONE SEEN LPF  Yeast NONE SEEN HPF   Old records or history reviewed: ER notes as above.  The following images/tracing/specimen were independently visualized:  CT scan as above.  The following clinical lab reports were reviewed:  UA: Both red and white cells were noted in his urine but it did not appear infected.  The following radiology reports were reviewed: CT scan.    Assessment Assessed  1. Renal calculus, left (N20.0) 2. Ureteral calculus, right (N20.1) 3. Hydronephrosis, right  (N13.30) 4. 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 Bilateral renal cysts (N28.1)  I obtained a KUB today. It reveals a stone in the area of the intramural ureter on the right side appears to remain present in that location. The left renal calculus is unchanged as well. He is primarily symptomatic from his left renal calculus at this time. He said he thought he passed his right ureteral stone as he is not having any pain on that side at all now. I think he may still have a stone there although it appears that there is a faint density that may or may not actually be a stone. He has passed 3 other stones so I think the probability that this will pass spontaneously is quite high. He is mainly symptomatic from his left renal calculus at this time.      We discussed the management of urinary stones. These options include observation, ureteroscopy, shockwave lithotripsy, and PCNL. We discussed which options are relevant to these particular stones. We discussed the natural history of stones as well as the complications of untreated stones and the impact on quality of life without treatment as well as with each of the above listed treatments. We also discussed the efficacy of each treatment in its ability to clear the stone burden. With any of these management options I discussed the signs and symptoms of infection and the need for emergent treatment should these be experienced. For each option we discussed the ability of each procedure to clear the patient of their stone burden.    For observation I described the risks which include but are not limited to silent renal damage, life-threatening infection, need for emergent surgery, failure to pass stone, and pain.    For ureteroscopy I described the risks which include heart attack, stroke, pulmonary embolus, death, bleeding, infection, damage to contiguous structures, positioning injury, ureteral stricture, ureteral avulsion, ureteral injury, need for ureteral stent,  inability to perform ureteroscopy, need for an interval procedure, inability to clear stone burden, stent discomfort and pain.    For shockwave lithotripsy I described the risks which include arrhythmia, kidney contusion, kidney hemorrhage, need for transfusion, long-term risk of diabetes or hypertension, back discomfort, flank ecchymosis, flank abrasion, inability to break up stone, inability to pass stone fragments, Steinstrasse, infection associated with obstructing stones, need for different surgical procedure and possible need for repeat shockwave lithotripsy.    For PCNL I described the risks including heart attack, sure, pulmonary embolus, death, positioning injury, pneumothorax, hydrothorax, need for chest tube, inability to clear stone burden, renal laceration, arterial venous fistula or malformation, need for embolization of kidney, loss of kidney or renal function, need for repeat procedure, need for prolonged nephrostomy tube, ureteral avulsion and fistula.    Because his stone is not big enough to warrant PCNL I have primarily discussed ureteroscopy versus lithotripsy. He would like to proceed with treatment. I'm going to schedule him for lithotripsy of his left renal pelvic/UPJ stone. It is easily visible on KUB. I also have refilled his prescription for pain medication and tamsulosin   Plan Health Maintenance  1. UA With REFLEX; [Do Not Release]; Status:Complete;   Done:  [id number] 10:47AM Renal calculus, left  2. Start: OxyCODONE HCl - 10 MG Oral Tablet; TAKE 1 TABLET Every 6 hours PRN Pain 3. Follow-up Schedule Surgery Office  Follow-up  Status: Hold For - Appointment   Requested for: [id number] Renal calculus, left, Ureteral calculus, right  4. Start: Tamsulosin HCl - 0.4 MG Oral Capsule; TAKE ONE CAPSULE BY MOUTH AT  BEDTIME 5. KUB; Status:Complete;   [id number] 10:47AM  1. Prescriptions for oxycodone and tamsulosin.  2. He will be scheduled for lithotripsy of his  left renal calculus.

## 2016-01-12 ENCOUNTER — Encounter (HOSPITAL_COMMUNITY)
Admission: RE | Disposition: A | Discharge status: Home / Self Care | Payer: Self-pay | Source: Ambulatory Visit | Attending: Urology

## 2016-01-12 ENCOUNTER — Ambulatory Visit (HOSPITAL_COMMUNITY)
Admission: RE | Admit: 2016-01-12 | Discharge: 2016-01-12 | Disposition: A | Discharge status: Home / Self Care | Payer: Medicare Other | Source: Ambulatory Visit | Attending: Urology | Admitting: Urology

## 2016-01-12 ENCOUNTER — Observation Stay (HOSPITAL_COMMUNITY)
Admission: EM | Admit: 2016-01-12 | Discharge: 2016-01-13 | Disposition: A | Discharge status: Home / Self Care | Payer: Medicare Other | Attending: Urology | Admitting: Urology

## 2016-01-12 ENCOUNTER — Encounter (HOSPITAL_COMMUNITY): Payer: Self-pay | Admitting: *Deleted

## 2016-01-12 ENCOUNTER — Emergency Department (HOSPITAL_COMMUNITY): Payer: Medicare Other

## 2016-01-12 ENCOUNTER — Ambulatory Visit (HOSPITAL_COMMUNITY): Payer: Medicare Other

## 2016-01-12 ENCOUNTER — Encounter (HOSPITAL_COMMUNITY): Payer: Self-pay | Admitting: Emergency Medicine

## 2016-01-12 DIAGNOSIS — Z79891 Long term (current) use of opiate analgesic: Secondary | ICD-10-CM | POA: Diagnosis not present

## 2016-01-12 DIAGNOSIS — N281 Cyst of kidney, acquired: Secondary | ICD-10-CM | POA: Insufficient documentation

## 2016-01-12 DIAGNOSIS — F17200 Nicotine dependence, unspecified, uncomplicated: Secondary | ICD-10-CM | POA: Diagnosis not present

## 2016-01-12 DIAGNOSIS — N132 Hydronephrosis with renal and ureteral calculous obstruction: Secondary | ICD-10-CM | POA: Insufficient documentation

## 2016-01-12 DIAGNOSIS — N201 Calculus of ureter: Secondary | ICD-10-CM | POA: Diagnosis not present

## 2016-01-12 DIAGNOSIS — N200 Calculus of kidney: Secondary | ICD-10-CM

## 2016-01-12 DIAGNOSIS — E785 Hyperlipidemia, unspecified: Secondary | ICD-10-CM | POA: Diagnosis not present

## 2016-01-12 DIAGNOSIS — R10A0 Flank pain, unspecified side: Secondary | ICD-10-CM

## 2016-01-12 DIAGNOSIS — Z79899 Other long term (current) drug therapy: Secondary | ICD-10-CM | POA: Diagnosis not present

## 2016-01-12 DIAGNOSIS — R109 Unspecified abdominal pain: Secondary | ICD-10-CM | POA: Diagnosis present

## 2016-01-12 HISTORY — DX: Unspecified convulsions: R56.9

## 2016-01-12 LAB — CBC WITH DIFFERENTIAL/PLATELET
Basophils Absolute: 0 K/uL (ref 0.0–0.1)
Basophils Relative: 0 %
Eosinophils Absolute: 0 K/uL (ref 0.0–0.7)
Eosinophils Relative: 0 %
HCT: 50.3 % (ref 39.0–52.0)
Hemoglobin: 17.7 g/dL — ABNORMAL HIGH (ref 13.0–17.0)
Lymphocytes Relative: 8 %
Lymphs Abs: 1 K/uL (ref 0.7–4.0)
MCH: 32.7 pg (ref 26.0–34.0)
MCHC: 35.2 g/dL (ref 30.0–36.0)
MCV: 92.8 fL (ref 78.0–100.0)
Monocytes Absolute: 0.9 K/uL (ref 0.1–1.0)
Monocytes Relative: 8 %
Neutro Abs: 9.9 K/uL — ABNORMAL HIGH (ref 1.7–7.7)
Neutrophils Relative %: 84 %
Platelets: 202 K/uL (ref 150–400)
RBC: 5.42 MIL/uL (ref 4.22–5.81)
RDW: 12.7 % (ref 11.5–15.5)
WBC: 11.8 K/uL — ABNORMAL HIGH (ref 4.0–10.5)

## 2016-01-12 LAB — I-STAT CHEM 8, ED
BUN: 33 mg/dL — ABNORMAL HIGH (ref 6–20)
Calcium, Ion: 1.11 mmol/L — ABNORMAL LOW (ref 1.12–1.23)
Chloride: 104 mmol/L (ref 101–111)
Creatinine, Ser: 1.3 mg/dL — ABNORMAL HIGH (ref 0.61–1.24)
Glucose, Bld: 92 mg/dL (ref 65–99)
HCT: 54 % — ABNORMAL HIGH (ref 39.0–52.0)
Hemoglobin: 18.4 g/dL — ABNORMAL HIGH (ref 13.0–17.0)
Potassium: 4.2 mmol/L (ref 3.5–5.1)
Sodium: 139 mmol/L (ref 135–145)
TCO2: 23 mmol/L (ref 0–100)

## 2016-01-12 SURGERY — LITHOTRIPSY, ESWL
Anesthesia: LOCAL | Laterality: Left

## 2016-01-12 MED ORDER — SODIUM CHLORIDE 0.9 % IV BOLUS (SEPSIS)
500.0000 mL | Freq: Once | INTRAVENOUS | Status: AC
  Administered 2016-01-12: 500 mL via INTRAVENOUS

## 2016-01-12 MED ORDER — FENTANYL CITRATE (PF) 100 MCG/2ML IJ SOLN: 25.0000 ug | INTRAMUSCULAR | Status: DC | PRN

## 2016-01-12 MED ORDER — SODIUM CHLORIDE 0.9% FLUSH: 3.0000 mL | Freq: Two times a day (BID) | INTRAVENOUS | Status: DC

## 2016-01-12 MED ORDER — OXYCODONE HCL 5 MG PO TABS
5.0000 mg | ORAL_TABLET | ORAL | Status: DC | PRN
  Administered 2016-01-12: 5 mg via ORAL
  Filled 2016-01-12: qty 1

## 2016-01-12 MED ORDER — FENTANYL CITRATE (PF) 100 MCG/2ML IJ SOLN
50.0000 ug | Freq: Once | INTRAMUSCULAR | Status: AC
  Administered 2016-01-12: 50 ug via INTRAVENOUS
  Filled 2016-01-12: qty 2

## 2016-01-12 MED ORDER — SODIUM CHLORIDE 0.9% FLUSH: 3.0000 mL | INTRAVENOUS | Status: DC | PRN

## 2016-01-12 MED ORDER — MORPHINE SULFATE (PF) 4 MG/ML IV SOLN
4.0000 mg | Freq: Once | INTRAVENOUS | Status: AC
  Administered 2016-01-12: 4 mg via INTRAVENOUS
  Filled 2016-01-12: qty 1

## 2016-01-12 MED ORDER — SODIUM CHLORIDE 0.9 % IV SOLN
INTRAVENOUS | Status: DC
  Administered 2016-01-12: 11:00:00 via INTRAVENOUS

## 2016-01-12 MED ORDER — ACETAMINOPHEN 325 MG PO TABS: 650.0000 mg | ORAL_TABLET | ORAL | Status: DC | PRN

## 2016-01-12 MED ORDER — ONDANSETRON HCL 4 MG/2ML IJ SOLN
4.0000 mg | Freq: Once | INTRAMUSCULAR | Status: AC
  Administered 2016-01-12: 4 mg via INTRAVENOUS
  Filled 2016-01-12: qty 2

## 2016-01-12 MED ORDER — DIAZEPAM 5 MG PO TABS
10.0000 mg | ORAL_TABLET | ORAL | Status: AC
  Administered 2016-01-12: 10 mg via ORAL
  Filled 2016-01-12: qty 2

## 2016-01-12 MED ORDER — ACETAMINOPHEN 650 MG RE SUPP: 650.0000 mg | RECTAL | Status: DC | PRN

## 2016-01-12 MED ORDER — SODIUM CHLORIDE 0.9 % IV SOLN: 250.0000 mL | INTRAVENOUS | Status: DC | PRN

## 2016-01-12 MED ORDER — CIPROFLOXACIN HCL 500 MG PO TABS
500.0000 mg | ORAL_TABLET | ORAL | Status: AC
  Administered 2016-01-12: 500 mg via ORAL
  Filled 2016-01-12: qty 1

## 2016-01-12 MED ORDER — OXYCODONE HCL 10 MG PO TABS: 10.0000 mg | ORAL_TABLET | Freq: Four times a day (QID) | ORAL | Status: DC | PRN

## 2016-01-12 NOTE — ED Notes (Signed)
Bed: WA20 Expected date:  Expected time:  Means of arrival:  Comments: EMS-vomiting 

## 2016-01-12 NOTE — Discharge Instructions (Signed)
Lithotripsy, Care After °Refer to this sheet in the next few weeks. These instructions provide you with information on caring for yourself after your procedure. Your health care provider may also give you more specific instructions. Your treatment has been planned according to current medical practices, but problems sometimes occur. Call your health care provider if you have any problems or questions after your procedure. °WHAT TO EXPECT AFTER THE PROCEDURE  °· Your urine may have a red tinge for a few days after treatment. Blood loss is usually minimal. °· You may have soreness in the back or flank area. This usually goes away after a few days. The procedure can cause blotches or bruises on the back where the pressure wave enters the skin. These marks usually cause only minimal discomfort and should disappear in a short time. °· Stone fragments should begin to pass within 24 hours of treatment. However, a delayed passage is not unusual. °· You may have pain, discomfort, and feel sick to your stomach (nauseated) when the crushed fragments of stone are passed down the tube from the kidney to the bladder. Stone fragments can pass soon after the procedure and may last for up to 4-8 weeks. °· A small number of patients may have severe pain when stone fragments are not able to pass, which leads to an obstruction. °· If your stone is greater than 1 inch (2.5 cm) in diameter or if you have multiple stones that have a combined diameter greater than 1 inch (2.5 cm), you may require more than one treatment. °· If you had a stent placed prior to your procedure, you may experience some discomfort, especially during urination. You may experience the pain or discomfort in your flank or back, or you may experience a sharp pain or discomfort at the base of your penis or in your lower abdomen. The discomfort usually lasts only a few minutes after urinating. °HOME CARE INSTRUCTIONS  °· Rest at home until you feel your energy  improving. °· Only take over-the-counter or prescription medicines for pain, discomfort, or fever as directed by your health care provider. Depending on the type of lithotripsy, you may need to take antibiotics and anti-inflammatory medicines for a few days. °· Drink enough water and fluids to keep your urine clear or pale yellow. This helps "flush" your kidneys. It helps pass any remaining pieces of stone and prevents stones from coming back. °· Most people can resume daily activities within 1-2 days after standard lithotripsy. It can take longer to recover from laser and percutaneous lithotripsy. °· Strain all urine through the provided strainer. Keep all particulate matter and stones for your health care provider to see. The stone may be as small as a grain of salt. It is very important to use the strainer each and every time you pass your urine. Any stones that are found can be sent to a medical lab for examination. °· Visit your health care provider for a follow-up appointment in a few weeks. Your doctor may remove your stent if you have one. Your health care provider will also check to see whether stone particles still remain. °SEEK MEDICAL CARE IF:  °· Your pain is not relieved by medicine. °· You have a lasting nauseous feeling. °· You feel there is too much blood in the urine. °· You develop persistent problems with frequent or painful urination that does not at least partially improve after 2 days following the procedure. °· You have a congested cough. °· You feel   lightheaded. °· You develop a rash or any other signs that might suggest an allergic problem. °· You develop any reaction or side effects to your medicine(s). °SEEK IMMEDIATE MEDICAL CARE IF:  °· You experience severe back or flank pain or both. °· You see nothing but blood when you urinate. °· You cannot pass any urine at all. °· You have a fever or shaking chills. °· You develop shortness of breath, difficulty breathing, or chest pain. °· You  develop vomiting that will not stop after 6-8 hours. °· You have a fainting episode. °  °This information is not intended to replace advice given to you by your health care provider. Make sure you discuss any questions you have with your health care provider. °  °Document Released: 11/04/2007 Document Revised: 07/06/2015 Document Reviewed: 04/30/2013 °Elsevier Interactive Patient Education ©2016 [employer] ° °

## 2016-01-12 NOTE — ED Notes (Signed)
Per EMS: pt has had two oxycodone, pt had lithotripsy today. Pt c/o n/v.

## 2016-01-12 NOTE — ED Provider Notes (Signed)
CSN: [id number]     Arrival date & time 01/12/16  1913 History   First MD Initiated Contact with Patient 01/12/16 1935     Chief Complaint  Patient presents with  . Emesis     (Consider location/radiation/quality/duration/timing/severity/associated sxs/prior Treatment) HPI  Cedrick Aubry is a 40 y.o M Who presents the emergency department complaining of flank pain after undergoing lithotripsy this morning. Patient states that he is a progressively worsening flank pain after the procedure, 10/10. The pain is in bilateral flanks but is worse on the left side. He had 2 episodes of emesis earlier today which contained blood. Patient has not been able to control his pain at home. Patient also reports he had blood in his urine today. Denies fevers, chills, melena, hematochezia, dysuria.  Past Medical History  Diagnosis Date  . Hyperlipemia   . 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 Hand surgery      Right "pinky" finger amputation   History reviewed. No pertinent family history. Social History  Substance Use Topics  . Smoking status: Current Every Day Smoker    Types: Cigarettes  . Smokeless tobacco: None  . Alcohol Use: Yes     Comment: socail    Review of Systems  All other systems reviewed and are negative.     Allergies  Aspirin and Diphenhydramine  Home Medications   Prior to Admission medications   Medication Sig Start Date End Date Taking? Authorizing Provider  albuterol (PROAIR HFA) 108 (90 Base) MCG/ACT inhaler Inhale 2 puffs into the lungs every 6 (six) hours as needed for wheezing. Reported on 01/12/2016   Yes Historical Provider, MD  Oxycodone HCl 10 MG TABS Take 1 tablet (10 mg total) by mouth every 6 (six) hours as needed (severe pain). 01/12/16  Yes Irine Seal, MD  pravastatin (PRAVACHOL) 20 MG tablet Take 20 mg by mouth daily.   Yes Historical Provider, MD  tamsulosin (FLOMAX) 0.4 MG CAPS  capsule Take 1 capsule (0.4 mg total) by mouth daily. 12/30/15  Yes Domenic Moras, PA-C   BP 128/104 mmHg  Pulse 78  Temp(Src) 99.4 F (37.4 C) (Oral)  Resp 18  SpO2 99% Physical Exam  Constitutional: He is oriented to person, place, and time. He appears well-developed and well-nourished. No distress.  HENT:  Head: Normocephalic and atraumatic.  Mouth/Throat: No oropharyngeal exudate.  Eyes: Conjunctivae and EOM are normal. Pupils are equal, round, and reactive to light. Right eye exhibits no discharge. Left eye exhibits no discharge. No scleral icterus.  Cardiovascular: Normal rate, regular rhythm, normal heart sounds and intact distal pulses.  Exam reveals no gallop and no friction rub.   No murmur heard. Pulmonary/Chest: Effort normal and breath sounds normal. No respiratory distress. He has no wheezes. He has no rales. He exhibits no tenderness.  Abdominal: Soft. Bowel sounds are normal. He exhibits no distension. There is tenderness. There is no guarding.  Significant bruising over her left flank from lithotripsy today. Exquisite tenderness over this site and on right flank, worse on the left.  Musculoskeletal: Normal range of motion. He exhibits no edema.  Neurological: He is alert and oriented to person, place, and time.  Skin: Skin is warm and dry. No rash noted. He is not diaphoretic. No erythema. No pallor.  Psychiatric: He has a normal mood and affect. His behavior is normal.  Nursing note and vitals reviewed.   ED Course  Procedures (including critical care time) Labs Review Labs Reviewed  URINALYSIS, ROUTINE W REFLEX MICROSCOPIC (NOT AT [facility])  CBC WITH DIFFERENTIAL/PLATELET  I-STAT CHEM 8, ED    Imaging Review Ct Abdomen Pelvis Wo Contrast  01/12/2016  CLINICAL DATA:  Lithotripsy today.  Severe abdominal pain. EXAM: CT ABDOMEN AND PELVIS WITHOUT CONTRAST TECHNIQUE: Multidetector CT imaging of the abdomen and pelvis was performed following the standard protocol without IV  contrast. COMPARISON:  12/30/2015 FINDINGS: Lower chest and abdominal wall:  No contributory findings. Hepatobiliary: No focal liver abnormality.No evidence of biliary obstruction or stone. Pancreas: Unremarkable. Spleen: Unremarkable. Adrenals/Urinary Tract:  Negative adrenals. Recent shock wave lithotripsy with fragmentation of previously seen stone. Numerous fragments are present in the left intrarenal collecting system. Largest conglomerate of these intrarenal calculi measure 3 mm in the renal pelvis. There are 5 distal left ureteral calculi including a 6 x2 mm rod like stone at the ureteral vesicular junction. Moderate left hydroureteronephrosis and new asymmetric left perinephric edema. The edema could be from obstruction or lithotripsy. Subtle patchy left cortical high density is new and attributed to hemorrhage. No dominant or subcapsular hematoma is seen. No right hydronephrosis. Collapsed bladder. Reproductive:No pathologic findings. Stomach/Bowel: Circumferential rectal thickening which has been present since at least 2013 abdominal CT. Vascular/Lymphatic: No acute vascular abnormality. No mass or adenopathy. Peritoneal: No ascites or pneumoperitoneum. Musculoskeletal: No acute abnormalities. IMPRESSION: 1. High-grade left urinary obstruction from multiple distal ureteral stone fragments. The largest measures 6 x 2 mm at the UVJ. 2. Scattered small areas of left renal cortical hemorrhage but no dominant or subcapsular hematoma. 3. Nonspecific rectal wall thickening, likely present since 2013. Electronically Signed   By: Monte Fantasia M.D.   On: 01/12/2016 23:52   Dg Abd 1 View  01/12/2016  CLINICAL DATA:  Lithotripsy today. Abdominal pain with nausea and vomiting. Evaluate stone fragment EXAM: ABDOMEN - 1 VIEW COMPARISON:  Abdominal plain film from earlier same day. FINDINGS: The dominant 10 mm left renal stone appreciated on the earlier exam is no longer seen, replaced by multiple smaller (tiny)  fragments which are presumably within the renal pelvis, at the level of the L3 and L4 vertebral bodies. All calcifications are within the contours of the left renal shadow. No stone is seen along the expected course of the left ureter. Bowel gas pattern is nonobstructive. No evidence of soft tissue mass or abnormal fluid collection. No evidence of free intraperitoneal air. IMPRESSION: Multiple tiny stone fragments within the left abdomen which appear to reside within the left kidney based on the configuration of the left renal shadow. These tiny stone fragments are seen at the L3 and L4 vertebral body levels. No stone appreciated along the expected course of the left ureter. No other acute/significant findings. Electronically Signed   By: Franki Cabot M.D.   On: 01/12/2016 21:03   Dg Abd 1 View  01/12/2016  CLINICAL DATA:  Preoperative for left renal calculus removal. EXAM: ABDOMEN - 1 VIEW COMPARISON:  Multiple exams, including 01/02/2016 FINDINGS: Calcification projecting over the left renal hilum, 1.0 by 0.7 cm. Vascular calcifications in the anatomic pelvis. No other stones are seen. IMPRESSION: 1. Stable 1.0 cm calculus projecting over the left renal hilum. Electronically Signed   By: Van Clines M.D.   On: 01/12/2016 09:40   I have personally reviewed and evaluated these images and lab results as part of my medical decision-making.   EKG Interpretation None      MDM   Final diagnoses:  Flank pain    Patient with significant flank pain after lithotripsy was performed today. Patient appears to be very uncomfortable in the ER. All vital signs are stable. Patient given morphine and fentanyl still with minimal relief of his symptoms. KUB obtained which reveals multiple tiny stone fragments in the left abdomen, no stone appreciated along the left ureter. No other acute finding seen. Mild leukocytosis, WBC 11.8. Creatinine 1.3. I spoke with urology who recommends CT abdomen and pelvis  without contrast to rule out a hematoma. CT reveals high-grade left urinary obstruction from multiple distal ureteral stone fragments, largest measuring at 62 at the UVJ, scattered areas of left renal cortical hemorrhage but no subcapsular hematoma. I spoke to urology again he will admit patient for pain control. They will consult patient in the morning.  Case discussed with Dr. Eulis Foster who agrees with treatment plan.    Dondra Spry Lillian, PA-C 01/13/16 0021  Daleen Bo, MD 01/16/16 2202

## 2016-01-13 DIAGNOSIS — N201 Calculus of ureter: Secondary | ICD-10-CM | POA: Diagnosis not present

## 2016-01-13 MED ORDER — OXYCODONE HCL 5 MG PO TABS: 5.0000 mg | ORAL_TABLET | ORAL | Status: DC | PRN

## 2016-01-13 MED ORDER — HYOSCYAMINE SULFATE 0.125 MG SL SUBL
0.1250 mg | SUBLINGUAL_TABLET | SUBLINGUAL | Status: DC | PRN
  Filled 2016-01-13: qty 1

## 2016-01-13 MED ORDER — HYDROMORPHONE HCL 1 MG/ML IJ SOLN
1.0000 mg | INTRAMUSCULAR | Status: DC | PRN
  Administered 2016-01-13: 1 mg via INTRAVENOUS
  Filled 2016-01-13: qty 1

## 2016-01-13 MED ORDER — HYDROMORPHONE HCL 1 MG/ML IJ SOLN
1.0000 mg | Freq: Once | INTRAMUSCULAR | Status: DC
  Filled 2016-01-13: qty 1

## 2016-01-13 MED ORDER — RANITIDINE HCL 150 MG PO TABS: 150.0000 mg | ORAL_TABLET | Freq: Two times a day (BID) | ORAL | Status: DC

## 2016-01-13 MED ORDER — PROMETHAZINE HCL 12.5 MG PO TABS: 12.5000 mg | ORAL_TABLET | Freq: Four times a day (QID) | ORAL | Status: DC | PRN

## 2016-01-13 MED ORDER — HYDROMORPHONE HCL 1 MG/ML IJ SOLN: 0.5000 mg | INTRAMUSCULAR | Status: DC | PRN

## 2016-01-13 MED ORDER — HYDROMORPHONE HCL 4 MG PO TABS: 4.0000 mg | ORAL_TABLET | ORAL | Status: DC | PRN

## 2016-01-13 MED ORDER — FENTANYL CITRATE (PF) 100 MCG/2ML IJ SOLN
100.0000 ug | INTRAMUSCULAR | Status: DC | PRN
  Administered 2016-01-13 (×2): 100 ug via INTRAVENOUS
  Filled 2016-01-13 (×2): qty 2

## 2016-01-13 MED ORDER — ACETAMINOPHEN 325 MG PO TABS: 650.0000 mg | ORAL_TABLET | ORAL | Status: DC | PRN

## 2016-01-13 MED ORDER — SODIUM CHLORIDE 0.9 % IV SOLN
INTRAVENOUS | Status: DC
  Administered 2016-01-13: 05:00:00 via INTRAVENOUS

## 2016-01-13 MED ORDER — HYDROMORPHONE HCL 2 MG PO TABS: 4.0000 mg | ORAL_TABLET | ORAL | Status: DC | PRN

## 2016-01-13 MED ORDER — ONDANSETRON HCL 4 MG/2ML IJ SOLN
4.0000 mg | INTRAMUSCULAR | Status: DC | PRN
  Administered 2016-01-13: 4 mg via INTRAVENOUS
  Filled 2016-01-13: qty 2

## 2016-01-13 MED ORDER — FENTANYL CITRATE (PF) 100 MCG/2ML IJ SOLN
50.0000 ug | Freq: Once | INTRAMUSCULAR | Status: AC
  Administered 2016-01-13: 50 ug via INTRAVENOUS
  Filled 2016-01-13: qty 2

## 2016-01-13 MED ORDER — TAMSULOSIN HCL 0.4 MG PO CAPS: 0.4000 mg | ORAL_CAPSULE | Freq: Every day | ORAL | Status: DC

## 2016-01-13 NOTE — H&P (Signed)
Urology Admission H&P  Chief Complaint: left flank pain  History of Present Illness: Matthew Wade is a 40yo with a hx of nephrolithiasis who presented to the Er with severe, constant, sharp left flank pain. He underwent L ESWL yesterday morning. He has passed multiple stone fragments since the procedure. CT scan in the ER shows multiple distal 1-30mm ureteral calculi with moderate hydronephrosis. Small hematoma. He denies fevers. He has urgency, frequency and dysuria. His pain is poorly controlled in ER  Past Medical History  Diagnosis Date  . Hyperlipemia   . Seizures ([place])     per pt "granny seizure as child"   Past Surgical History  Procedure Laterality Date  . Hand surgery      Right "pinky" finger amputation    Home Medications:  Prescriptions prior to admission  Medication Sig Dispense Refill Last Dose  . albuterol (PROAIR HFA) 108 (90 Base) MCG/ACT inhaler Inhale 2 puffs into the lungs every 6 (six) hours as needed for wheezing. Reported on 01/12/2016   unknown  . Oxycodone HCl 10 MG TABS Take 1 tablet (10 mg total) by mouth every 6 (six) hours as needed (severe pain). 25 tablet 0 01/12/2016 at Unknown time  . pravastatin (PRAVACHOL) 20 MG tablet Take 20 mg by mouth daily.   01/11/2016 at Unknown time  . tamsulosin (FLOMAX) 0.4 MG CAPS capsule Take 1 capsule (0.4 mg total) by mouth daily. 7 capsule 0 01/12/2016 at Unknown time   Allergies:  Allergies  Allergen Reactions  . Aspirin     Itching   . Diphenhydramine     Swelling     History reviewed. No pertinent family history. Social History:  reports that he has been smoking Cigarettes.  He does not have any smokeless tobacco history on file. He reports that he drinks alcohol. He reports that he uses illicit drugs (Marijuana).  Review of Systems  Gastrointestinal: Positive for nausea and vomiting.  Genitourinary: Positive for dysuria, urgency, frequency and flank pain.  All other systems reviewed and are negative.   Physical  Exam:  Vital signs in last 24 hours: Temp:  [98.2 F (36.8 C)-99.4 F (37.4 C)] 98.6 F (37 C) (03/17 0435) Pulse Rate:  [51-93] 93 (03/17 0435) Resp:  [14-20] 18 (03/17 0435) BP: (128-148)/(77-104) 139/96 mmHg (03/17 0435) SpO2:  [96 %-100 %] 96 % (03/17 0435) Weight:  [81.647 kg (180 lb)] 81.647 kg (180 lb) (03/17 0435) Physical Exam  Constitutional: He is oriented to person, place, and time. He appears well-developed and well-nourished.  HENT:  Head: Normocephalic and atraumatic.  Eyes: EOM are normal. Pupils are equal, round, and reactive to light.  Neck: Normal range of motion. No thyromegaly present.  Cardiovascular: Normal rate and regular rhythm.   Respiratory: Effort normal. No respiratory distress.  GI: Soft. He exhibits no distension and no mass. There is no tenderness. There is no rebound and no guarding.  Musculoskeletal: Normal range of motion.  Neurological: He is alert and oriented to person, place, and time.  Skin: Skin is warm and dry.  Psychiatric: He has a normal mood and affect. His behavior is normal. Judgment and thought content normal.    Laboratory Data:  Results for orders placed or performed during the hospital encounter of 01/12/16 (from the past 24 hour(s))  CBC with Differential     Status: Abnormal   Collection Time: 01/12/16  8:49 PM  Result Value Ref Range   WBC 11.8 (H) 4.0 - 10.5 K/uL   RBC 5.42  4.22 - 5.81 MIL/uL   Hemoglobin 17.7 (H) 13.0 - 17.0 g/dL   HCT 50.3 39.0 - 52.0 %   MCV 92.8 78.0 - 100.0 fL   MCH 32.7 26.0 - 34.0 pg   MCHC 35.2 30.0 - 36.0 g/dL   RDW 12.7 11.5 - 15.5 %   Platelets 202 150 - 400 K/uL   Neutrophils Relative % 84 %   Neutro Abs 9.9 (H) 1.7 - 7.7 K/uL   Lymphocytes Relative 8 %   Lymphs Abs 1.0 0.7 - 4.0 K/uL   Monocytes Relative 8 %   Monocytes Absolute 0.9 0.1 - 1.0 K/uL   Eosinophils Relative 0 %   Eosinophils Absolute 0.0 0.0 - 0.7 K/uL   Basophils Relative 0 %   Basophils Absolute 0.0 0.0 - 0.1 K/uL   I-stat Chem 8, ED     Status: Abnormal   Collection Time: 01/12/16  8:59 PM  Result Value Ref Range   Sodium 139 135 - 145 mmol/L   Potassium 4.2 3.5 - 5.1 mmol/L   Chloride 104 101 - 111 mmol/L   BUN 33 (H) 6 - 20 mg/dL   Creatinine, Ser 1.30 (H) 0.61 - 1.24 mg/dL   Glucose, Bld 92 65 - 99 mg/dL   Calcium, Ion 1.11 (L) 1.12 - 1.23 mmol/L   TCO2 23 0 - 100 mmol/L   Hemoglobin 18.4 (H) 13.0 - 17.0 g/dL   HCT 54.0 (H) 39.0 - 52.0 %   No results found for this or any previous visit (from the past 240 hour(s)). Creatinine:  Recent Labs  01/12/16 2059  CREATININE 1.30*   Baseline Creatinine: unknown  Impression/Assessment:  40yo with ureteral calculi and intractable pain with nausea  Plan:  1. Admit for pain control 2. Pt NPO and will reassess in 4-5 hours. If his pain is poorly controlled he will likely need ureteroscopy with stone extraction  Lyric Hoar L 01/13/2016, 9:04 AM

## 2016-01-13 NOTE — ED Notes (Signed)
Verbal order

## 2016-01-13 NOTE — Discharge Instructions (Signed)
Lithotripsy, Care After °Refer to this sheet in the next few weeks. These instructions provide you with information on caring for yourself after your procedure. Your health care provider may also give you more specific instructions. Your treatment has been planned according to current medical practices, but problems sometimes occur. Call your health care provider if you have any problems or questions after your procedure. °WHAT TO EXPECT AFTER THE PROCEDURE  °· Your urine may have a red tinge for a few days after treatment. Blood loss is usually minimal. °· You may have soreness in the back or flank area. This usually goes away after a few days. The procedure can cause blotches or bruises on the back where the pressure wave enters the skin. These marks usually cause only minimal discomfort and should disappear in a short time. °· Stone fragments should begin to pass within 24 hours of treatment. However, a delayed passage is not unusual. °· You may have pain, discomfort, and feel sick to your stomach (nauseated) when the crushed fragments of stone are passed down the tube from the kidney to the bladder. Stone fragments can pass soon after the procedure and may last for up to 4-8 weeks. °· A small number of patients may have severe pain when stone fragments are not able to pass, which leads to an obstruction. °· If your stone is greater than 1 inch (2.5 cm) in diameter or if you have multiple stones that have a combined diameter greater than 1 inch (2.5 cm), you may require more than one treatment. °· If you had a stent placed prior to your procedure, you may experience some discomfort, especially during urination. You may experience the pain or discomfort in your flank or back, or you may experience a sharp pain or discomfort at the base of your penis or in your lower abdomen. The discomfort usually lasts only a few minutes after urinating. °HOME CARE INSTRUCTIONS  °· Rest at home until you feel your energy  improving. °· Only take over-the-counter or prescription medicines for pain, discomfort, or fever as directed by your health care provider. Depending on the type of lithotripsy, you may need to take antibiotics and anti-inflammatory medicines for a few days. °· Drink enough water and fluids to keep your urine clear or pale yellow. This helps "flush" your kidneys. It helps pass any remaining pieces of stone and prevents stones from coming back. °· Most people can resume daily activities within 1-2 days after standard lithotripsy. It can take longer to recover from laser and percutaneous lithotripsy. °· Strain all urine through the provided strainer. Keep all particulate matter and stones for your health care provider to see. The stone may be as small as a grain of salt. It is very important to use the strainer each and every time you pass your urine. Any stones that are found can be sent to a medical lab for examination. °· Visit your health care provider for a follow-up appointment in a few weeks. Your doctor may remove your stent if you have one. Your health care provider will also check to see whether stone particles still remain. °SEEK MEDICAL CARE IF:  °· Your pain is not relieved by medicine. °· You have a lasting nauseous feeling. °· You feel there is too much blood in the urine. °· You develop persistent problems with frequent or painful urination that does not at least partially improve after 2 days following the procedure. °· You have a congested cough. °· You feel   lightheaded. °· You develop a rash or any other signs that might suggest an allergic problem. °· You develop any reaction or side effects to your medicine(s). °SEEK IMMEDIATE MEDICAL CARE IF:  °· You experience severe back or flank pain or both. °· You see nothing but blood when you urinate. °· You cannot pass any urine at all. °· You have a fever or shaking chills. °· You develop shortness of breath, difficulty breathing, or chest pain. °· You  develop vomiting that will not stop after 6-8 hours. °· You have a fainting episode. °  °This information is not intended to replace advice given to you by your health care provider. Make sure you discuss any questions you have with your health care provider. °  °Document Released: 11/04/2007 Document Revised: 07/06/2015 Document Reviewed: 04/30/2013 °Elsevier Interactive Patient Education ©2016 [employer] ° °

## 2016-01-13 NOTE — ED Notes (Addendum)
Spoke to Dr. Alyson Ingles.  Dr. Alyson Ingles advised a verbal order for 193mcg of Fentaynl q1hr PRN.

## 2016-01-13 NOTE — Care Management Note (Signed)
Case Management Note  Patient Details  Name: Matthew Wade. MRN: [id number] Date of Birth: 1976-06-26  Subjective/Objective:       ureteroscopy, shockwave lithotripsy            Action/Plan: Discharge Planning: NCM spoke to pt. Lives at home with wife and small children. He can afford his medications. Pt has Medicaid. Has transportation to his appts.   PCP- Katheren Shams   Expected Discharge Date:  01/13/2016              Expected Discharge Plan:  Home/Self Care  In-House Referral:  NA  Discharge planning Services  CM Consult  Post Acute Care Choice:  NA Choice offered to:  NA  DME Arranged:  N/A DME Agency:  NA  HH Arranged:  NA HH Agency:  NA  Status of Service:  Completed, signed off  Medicare Important Message Given:    Date Medicare IM Given:    Medicare IM give by:    Date Additional Medicare IM Given:    Additional Medicare Important Message give by:     If discussed at [place] of Stay Meetings, dates discussed:    Additional Comments:  Erenest Rasher, RN 01/13/2016, 10:30 AM

## 2016-01-13 NOTE — Care Management Obs Status (Signed)
[facility] NOTIFICATION   Patient Details  Name: Matthew Wade. MRN: [id number] Date of Birth: 10/31/1975   Medicare Observation Status Notification Given:  Yes    Erenest Rasher, RN 01/13/2016, 10:15 AM

## 2016-01-19 NOTE — Discharge Summary (Addendum)
Physician Discharge Summary  Patient ID: Matthew Wade. MRN: [id number] DOB/AGE: 29-Jun-1976 40 y.o.  Admit date: 01/12/2016 Discharge date: 01/13/2016 Admission Diagnoses: Ureteral calculus Discharge Diagnoses:  Active Problems:   Ureteral calculi   Discharged Condition: good  Hospital Course: The patient was admitted for pain control after ESWL. Prior to discharge the pt was tolerating a regular diet, pain was controlled on PO pain meds, they were ambulating without difficulty, and they had normal bowel function.    Consults: None  Significant Diagnostic Studies: none  Treatments: IV hydration and narcotic analgesia  Discharge Exam: Blood pressure 139/96, pulse 93, temperature 98.6 F (37 C), temperature source Oral, resp. rate 18, height 5\' 8"  (1.727 m), weight 81.647 kg (180 lb), SpO2 96 %. General appearance: alert, cooperative and appears stated age Head: Normocephalic, without obvious abnormality, atraumatic Eyes: conjunctivae/corneas clear. PERRL, EOM's intact. Fundi benign. Nose: Nares normal. Septum midline. Mucosa normal. No drainage or sinus tenderness. Cardio: regular rate and rhythm, S1, S2 normal, no murmur, click, rub or gallop GI: soft, non-tender; bowel sounds normal; no masses,  no organomegaly Extremities: extremities normal, atraumatic, no cyanosis or edema Neurologic: Grossly normal  Disposition: 01-Home or Self Care     Medication List    STOP taking these medications        Oxycodone HCl 10 MG Tabs      TAKE these medications        HYDROmorphone 4 MG tablet  Commonly known as:  DILAUDID  Take 1 tablet (4 mg total) by mouth every 3 (three) hours as needed for moderate pain or severe pain.     pravastatin 20 MG tablet  Commonly known as:  PRAVACHOL  Take 20 mg by mouth daily.     PROAIR HFA 108 (90 Base) MCG/ACT inhaler  Generic drug:  albuterol  Inhale 2 puffs into the lungs every 6 (six) hours as needed for wheezing. Reported on  01/12/2016     promethazine 12.5 MG tablet  Commonly known as:  PHENERGAN  Take 1 tablet (12.5 mg total) by mouth every 6 (six) hours as needed for nausea or vomiting.     ranitidine 150 MG tablet  Commonly known as:  ZANTAC  Take 1 tablet (150 mg total) by mouth 2 (two) times daily.     tamsulosin 0.4 MG Caps capsule  Commonly known as:  FLOMAX  Take 1 capsule (0.4 mg total) by mouth daily.           Follow-up Information    Follow up with Karen Kays, NP. Call in 2 weeks.   Specialty:  Nurse Practitioner   Contact information:   [address] [place] [address] [phone]       Signed: Cleon Gustin 01/19/2016, 8:56 AM

## 2016-02-07 ENCOUNTER — Encounter: Payer: Self-pay | Admitting: *Deleted

## 2016-02-08 ENCOUNTER — Encounter
Admission: RE | Disposition: A | Discharge status: Home / Self Care | Payer: Self-pay | Source: Ambulatory Visit | Attending: Unknown Physician Specialty

## 2016-02-08 ENCOUNTER — Ambulatory Visit: Payer: Medicare Other | Admitting: *Deleted

## 2016-02-08 ENCOUNTER — Ambulatory Visit
Admission: RE | Admit: 2016-02-08 | Discharge: 2016-02-08 | Disposition: A | Discharge status: Home / Self Care | Payer: Medicare Other | Source: Ambulatory Visit | Attending: Unknown Physician Specialty | Admitting: Unknown Physician Specialty

## 2016-02-08 ENCOUNTER — Encounter: Payer: Self-pay | Admitting: *Deleted

## 2016-02-08 DIAGNOSIS — K640 First degree hemorrhoids: Secondary | ICD-10-CM | POA: Insufficient documentation

## 2016-02-08 DIAGNOSIS — F17210 Nicotine dependence, cigarettes, uncomplicated: Secondary | ICD-10-CM | POA: Diagnosis not present

## 2016-02-08 DIAGNOSIS — Z1211 Encounter for screening for malignant neoplasm of colon: Secondary | ICD-10-CM | POA: Diagnosis not present

## 2016-02-08 DIAGNOSIS — D120 Benign neoplasm of cecum: Secondary | ICD-10-CM | POA: Insufficient documentation

## 2016-02-08 DIAGNOSIS — Z7982 Long term (current) use of aspirin: Secondary | ICD-10-CM | POA: Insufficient documentation

## 2016-02-08 DIAGNOSIS — E785 Hyperlipidemia, unspecified: Secondary | ICD-10-CM | POA: Diagnosis not present

## 2016-02-08 DIAGNOSIS — Z800 Family history of malignant neoplasm of digestive organs: Secondary | ICD-10-CM | POA: Diagnosis not present

## 2016-02-08 DIAGNOSIS — D125 Benign neoplasm of sigmoid colon: Secondary | ICD-10-CM | POA: Insufficient documentation

## 2016-02-08 DIAGNOSIS — K319 Disease of stomach and duodenum, unspecified: Secondary | ICD-10-CM | POA: Diagnosis not present

## 2016-02-08 DIAGNOSIS — K2980 Duodenitis without bleeding: Secondary | ICD-10-CM | POA: Diagnosis not present

## 2016-02-08 DIAGNOSIS — K2970 Gastritis, unspecified, without bleeding: Secondary | ICD-10-CM | POA: Diagnosis not present

## 2016-02-08 HISTORY — PX: ESOPHAGOGASTRODUODENOSCOPY (EGD) WITH PROPOFOL: SHX5813

## 2016-02-08 HISTORY — PX: COLONOSCOPY WITH PROPOFOL: SHX5780

## 2016-02-08 SURGERY — COLONOSCOPY WITH PROPOFOL
Anesthesia: General

## 2016-02-08 MED ORDER — MIDAZOLAM HCL 5 MG/5ML IJ SOLN
INTRAMUSCULAR | Status: DC | PRN
  Administered 2016-02-08 (×2): 1 mg via INTRAVENOUS

## 2016-02-08 MED ORDER — PROPOFOL 10 MG/ML IV BOLUS
INTRAVENOUS | Status: DC | PRN
  Administered 2016-02-08: 50 mg via INTRAVENOUS

## 2016-02-08 MED ORDER — SODIUM CHLORIDE 0.9 % IV SOLN: INTRAVENOUS | Status: DC

## 2016-02-08 MED ORDER — PROPOFOL 500 MG/50ML IV EMUL
INTRAVENOUS | Status: DC | PRN
  Administered 2016-02-08: 100 ug/kg/min via INTRAVENOUS

## 2016-02-08 MED ORDER — SODIUM CHLORIDE 0.9 % IV SOLN
INTRAVENOUS | Status: DC
  Administered 2016-02-08: 09:00:00 via INTRAVENOUS

## 2016-02-08 MED ORDER — FENTANYL CITRATE (PF) 100 MCG/2ML IJ SOLN
INTRAMUSCULAR | Status: DC | PRN
  Administered 2016-02-08: 50 ug via INTRAVENOUS

## 2016-02-08 MED ORDER — LIDOCAINE HCL (PF) 2 % IJ SOLN
INTRAMUSCULAR | Status: DC | PRN
  Administered 2016-02-08: 80 mg

## 2016-02-08 NOTE — Op Note (Signed)
[facility] Gastroenterology Patient Name: Matthew Wade Procedure Date: 02/08/2016 9:19 AM MRN: [id number] Account #: [id number] Date of Birth: 12/30/1975 Admit Type: Outpatient Age: 40 Room: [facility] ENDO ROOM 4 Gender: Male Note Status: Finalized Procedure:            Colonoscopy Indications:          Family history of colon cancer in a first-degree                        relative Providers:            Manya Silvas, MD Referring MD:         No Local Md, MD (Referring MD) Medicines:            Propofol per Anesthesia Complications:        No immediate complications. Procedure:            Pre-Anesthesia Assessment:                       - After reviewing the risks and benefits, the patient                        was deemed in satisfactory condition to undergo the                        procedure.                       After obtaining informed consent, the colonoscope was                        passed under direct vision. Throughout the procedure,                        the patient's blood pressure, pulse, and oxygen                        saturations were monitored continuously. The                        Colonoscope was introduced through the anus and                        advanced to the the cecum, identified by appendiceal                        orifice and ileocecal valve. The colonoscopy was                        performed without difficulty. The patient tolerated the                        procedure well. The quality of the bowel preparation                        was good. Findings:      A diminutive polyp was found in the sigmoid colon. The polyp was       sessile. The polyp was removed with a jumbo cold forceps. Resection and       retrieval were complete.      A diminutive polyp was found  in the cecum. The polyp was sessile. The       polyp was removed with a jumbo cold forceps. Resection and retrieval       were complete.      Internal  hemorrhoids were found during endoscopy. The hemorrhoids were       small and Grade I (internal hemorrhoids that do not prolapse).      The exam was otherwise without abnormality. Impression:           - One diminutive polyp in the sigmoid colon, removed                        with a jumbo cold forceps. Resected and retrieved.                       - One diminutive polyp in the cecum, removed with a                        jumbo cold forceps. Resected and retrieved.                       - Internal hemorrhoids.                       - The examination was otherwise normal. Recommendation:       - Await pathology results. Manya Silvas, MD 02/08/2016 10:00:22 AM This report has been signed electronically. Number of Addenda: 0 Note Initiated On: 02/08/2016 9:19 AM Scope Withdrawal Time: 0 hours 14 minutes 30 seconds  Total Procedure Duration: 0 hours 20 minutes 37 seconds       [facility]

## 2016-02-08 NOTE — Anesthesia Postprocedure Evaluation (Signed)
Anesthesia Post Note  Patient: Matthew Wade.  Procedure(s) Performed: Procedure(s) (LRB): COLONOSCOPY WITH PROPOFOL (N/A) ESOPHAGOGASTRODUODENOSCOPY (EGD) WITH PROPOFOL (N/A)  Patient location during evaluation: Endoscopy Anesthesia Type: General Level of consciousness: awake Pain management: pain level controlled Vital Signs Assessment: post-procedure vital signs reviewed and stable Respiratory status: spontaneous breathing Cardiovascular status: blood pressure returned to baseline Postop Assessment: no headache Anesthetic complications: no    Last Vitals:  Filed Vitals:   02/08/16 1022 02/08/16 1032  BP: 113/84 119/90  Pulse: 78 74  Temp:    Resp: 17 24    Last Pain: There were no vitals filed for this visit.               Kailen Hinkle M

## 2016-02-08 NOTE — Anesthesia Preprocedure Evaluation (Addendum)
Anesthesia Evaluation  Patient identified by MRN, date of birth, ID band Patient awake    Reviewed: Allergy & Precautions, NPO status , Patient's Chart, lab work & pertinent test results  Airway Mallampati: I  TM Distance: >3 FB Neck ROM: Full    Dental  (+) Teeth Intact   Pulmonary Current Smoker,    Pulmonary exam normal        Cardiovascular Exercise Tolerance: Good negative cardio ROS Normal cardiovascular exam     Neuro/Psych Seizures as an infant--none since childhood.    GI/Hepatic   Endo/Other    Renal/GU      Musculoskeletal   Abdominal (+)  Abdomen: soft.    Peds  Hematology   Anesthesia Other Findings   Reproductive/Obstetrics                            Anesthesia Physical Anesthesia Plan  ASA: II  Anesthesia Plan: General   Post-op Pain Management:    Induction: Intravenous  Airway Management Planned: Nasal Cannula  Additional Equipment:   Intra-op Plan:   Post-operative Plan:   Informed Consent: I have reviewed the patients History and Physical, chart, labs and discussed the procedure including the risks, benefits and alternatives for the proposed anesthesia with the patient or authorized representative who has indicated his/her understanding and acceptance.     Plan Discussed with: CRNA  Anesthesia Plan Comments:         Anesthesia Quick Evaluation

## 2016-02-08 NOTE — Transfer of Care (Signed)
Immediate Anesthesia Transfer of Care Note  Patient: Matthew Wade.  Procedure(s) Performed: Procedure(s): COLONOSCOPY WITH PROPOFOL (N/A) ESOPHAGOGASTRODUODENOSCOPY (EGD) WITH PROPOFOL (N/A)  Patient Location: PACU  Anesthesia Type:General  Level of Consciousness: sedated  Airway & Oxygen Therapy: Patient Spontanous Breathing and Patient connected to nasal cannula oxygen  Post-op Assessment: Report given to RN and Post -op Vital signs reviewed and stable  Post vital signs: Reviewed and stable  Last Vitals:  Filed Vitals:   02/08/16 0850  BP: 125/84  Pulse: 96  Temp: 36.6 C  Resp: 16    Complications: No apparent anesthesia complications

## 2016-02-08 NOTE — Op Note (Signed)
[facility] Gastroenterology Patient Name: Matthew Wade Procedure Date: 02/08/2016 9:20 AM MRN: [id number] Account #: [id number] Date of Birth: 1976-10-25 Admit Type: Outpatient Age: 40 Room: [facility] ENDO ROOM 4 Gender: Male Note Status: Finalized Procedure:            Upper GI endoscopy Indications:          follow up adenomatous polyp in duodenum Providers:            Manya Silvas, MD Referring MD:         No Local Md, MD (Referring MD) Medicines:            Propofol per Anesthesia Complications:        No immediate complications. Procedure:            Pre-Anesthesia Assessment:                       - After reviewing the risks and benefits, the patient                        was deemed in satisfactory condition to undergo the                        procedure.                       After obtaining informed consent, the endoscope was                        passed under direct vision. Throughout the procedure,                        the patient's blood pressure, pulse, and oxygen                        saturations were monitored continuously. The Endoscope                        was introduced through the mouth, and advanced to the                        second part of duodenum. The upper GI endoscopy was                        accomplished without difficulty. The patient tolerated                        the procedure well. Findings:      The examined esophagus was normal.      Diffuse, scattered and patchy moderate inflammation characterized by       erythema and granularity was found at the gastroesophageal junction, in       the gastric body and in the gastric antrum. Biopsies were taken with a       cold forceps for histology. Biopsies were taken with a cold forceps for       Helicobacter pylori testing.      Diffuse moderate inflammation characterized by erythema and granularity       was found in the duodenal bulb. Biopsies were taken with a cold  forceps       for histology. Biopsies for histology were taken with a cold forceps  for       evaluation of celiac disease.      The second portion of the duodenum was normal. Impression:           - Normal esophagus.                       - Gastritis. Biopsied.                       - Duodenitis. Biopsied.                       - Normal second portion of the duodenum. Recommendation:       - Await pathology results. Manya Silvas, MD 02/08/2016 9:35:41 AM This report has been signed electronically. Number of Addenda: 0 Note Initiated On: 02/08/2016 9:20 AM      [facility]

## 2016-02-08 NOTE — H&P (Signed)
   Primary Care Physician:  [facility] Primary Gastroenterologist:  Dr. Vira Agar  Pre-Procedure History & Physical: HPI:  Matthew Wade. is a 40 y.o. male is here for an endoscopy and colonoscopy.   Past Medical History  Diagnosis Date  . Hyperlipemia   . Seizures ([place])     per pt "granny seizure as child"    Past Surgical History  Procedure Laterality Date  . Hand surgery      Right "pinky" finger amputation    Prior to Admission medications   Medication Sig Start Date End Date Taking? Authorizing Provider  albuterol (PROAIR HFA) 108 (90 Base) MCG/ACT inhaler Inhale 2 puffs into the lungs every 6 (six) hours as needed for wheezing. Reported on 01/12/2016   Yes Historical Provider, MD  pravastatin (PRAVACHOL) 20 MG tablet Take 20 mg by mouth daily.   Yes Historical Provider, MD  ranitidine (ZANTAC) 150 MG tablet Take 1 tablet (150 mg total) by mouth 2 (two) times daily. 01/13/16  Yes Cleon Gustin, MD  tamsulosin (FLOMAX) 0.4 MG CAPS capsule Take 1 capsule (0.4 mg total) by mouth daily. 01/13/16  Yes Cleon Gustin, MD  HYDROmorphone (DILAUDID) 4 MG tablet Take 1 tablet (4 mg total) by mouth every 3 (three) hours as needed for moderate pain or severe pain. 01/13/16   Cleon Gustin, MD  promethazine (PHENERGAN) 12.5 MG tablet Take 1 tablet (12.5 mg total) by mouth every 6 (six) hours as needed for nausea or vomiting. 01/13/16   Cleon Gustin, MD    Allergies as of 01/31/2016 - Review Complete 01/12/2016  Allergen Reaction Noted  . Aspirin  08/07/2015  . Diphenhydramine  08/07/2015    History reviewed. No pertinent family history.  Social History   Social History  . Marital Status: Married    Spouse Name: N/A  . Number of Children: N/A  . Years of Education: N/A   Occupational History  . Not on file.   Social History Main Topics  . Smoking status: Current Every Day Smoker    Types: Cigarettes  . Smokeless tobacco: Not on file  .  Alcohol Use: Yes     Comment: socail  . Drug Use: Yes    Special: Marijuana     Comment: years ago  . Sexual Activity: Not on file   Other Topics Concern  . Not on file   Social History Narrative    Review of Systems: See HPI, otherwise negative ROS  Physical Exam: BP 125/84 mmHg  Pulse 96  Temp(Src) 97.9 F (36.6 C) (Tympanic)  Resp 16  Ht 5\' 8"  (1.727 m)  Wt 81.647 kg (180 lb)  BMI 27.38 kg/m2  SpO2 100% General:   Alert,  pleasant and cooperative in NAD Head:  Normocephalic and atraumatic. Neck:  Supple; no masses or thyromegaly. Lungs:  Clear throughout to auscultation.    Heart:  Regular rate and rhythm. Abdomen:  Soft, nontender and nondistended. Normal bowel sounds, without guarding, and without rebound.   Neurologic:  Alert and  oriented x4;  grossly normal neurologically.  Impression/Plan: Blima Ledger. is here for an endoscopy and colonoscopy to be performed for FH colon cancer and personal hx of duodenal adenomatous polyp  Risks, benefits, limitations, and alternatives regarding  endoscopy and colonoscopy have been reviewed with the patient.  Questions have been answered.  All parties agreeable.   Gaylyn Cheers, MD  02/08/2016, 9:21 AM

## 2016-02-10 LAB — SURGICAL PATHOLOGY

## 2016-09-27 ENCOUNTER — Encounter (HOSPITAL_COMMUNITY): Payer: Self-pay | Admitting: *Deleted

## 2016-09-27 ENCOUNTER — Emergency Department (HOSPITAL_COMMUNITY)
Admission: EM | Admit: 2016-09-27 | Discharge: 2016-09-28 | Disposition: A | Discharge status: Home / Self Care | Payer: Medicare Other | Attending: Emergency Medicine | Admitting: Emergency Medicine

## 2016-09-27 ENCOUNTER — Emergency Department (HOSPITAL_COMMUNITY): Payer: Medicare Other

## 2016-09-27 DIAGNOSIS — F17210 Nicotine dependence, cigarettes, uncomplicated: Secondary | ICD-10-CM | POA: Diagnosis not present

## 2016-09-27 DIAGNOSIS — R0789 Other chest pain: Secondary | ICD-10-CM | POA: Diagnosis not present

## 2016-09-27 LAB — CBC WITH DIFFERENTIAL/PLATELET
Basophils Absolute: 0 10*3/uL (ref 0.0–0.1)
Basophils Relative: 0 %
EOS PCT: 1 %
Eosinophils Absolute: 0.1 10*3/uL (ref 0.0–0.7)
HCT: 45.8 % (ref 39.0–52.0)
HEMOGLOBIN: 16.3 g/dL (ref 13.0–17.0)
LYMPHS ABS: 1.4 10*3/uL (ref 0.7–4.0)
LYMPHS PCT: 16 %
MCH: 32.7 pg (ref 26.0–34.0)
MCHC: 35.6 g/dL (ref 30.0–36.0)
MCV: 91.8 fL (ref 78.0–100.0)
Monocytes Absolute: 0.8 10*3/uL (ref 0.1–1.0)
Monocytes Relative: 9 %
Neutro Abs: 6.7 10*3/uL (ref 1.7–7.7)
Neutrophils Relative %: 74 %
PLATELETS: 198 10*3/uL (ref 150–400)
RBC: 4.99 MIL/uL (ref 4.22–5.81)
RDW: 13.5 % (ref 11.5–15.5)
WBC: 9.1 10*3/uL (ref 4.0–10.5)

## 2016-09-27 LAB — BASIC METABOLIC PANEL
Anion gap: 8 (ref 5–15)
BUN: 24 mg/dL — AB (ref 6–20)
CHLORIDE: 109 mmol/L (ref 101–111)
CO2: 20 mmol/L — ABNORMAL LOW (ref 22–32)
Calcium: 9.1 mg/dL (ref 8.9–10.3)
Creatinine, Ser: 1.1 mg/dL (ref 0.61–1.24)
GFR calc Af Amer: >60 mL/min (ref >60)
GFR calc non Af Amer: >60 mL/min (ref >60)
Glucose, Bld: 105 mg/dL — ABNORMAL HIGH (ref 65–99)
POTASSIUM: 3.6 mmol/L (ref 3.5–5.1)
SODIUM: 137 mmol/L (ref 135–145)

## 2016-09-27 LAB — I-STAT TROPONIN, ED
Troponin i, poc: 0 ng/mL (ref 0.00–0.08)
Troponin i, poc: 0 ng/mL (ref 0.00–0.08)

## 2016-09-27 MED ORDER — ACETAMINOPHEN 325 MG PO TABS
650.0000 mg | ORAL_TABLET | Freq: Once | ORAL | Status: AC
  Administered 2016-09-27: 650 mg via ORAL
  Filled 2016-09-27: qty 2

## 2016-09-27 NOTE — ED Notes (Signed)
Family at bedside. 

## 2016-09-27 NOTE — ED Notes (Signed)
Pt in gown, on monitor. Pt stating that he "smoked a little weed earlier, about 3-ish".

## 2016-09-27 NOTE — ED Notes (Signed)
Patient transported to X-ray 

## 2016-09-27 NOTE — ED Triage Notes (Signed)
Per EMS, pt from home, reports  He has been having n/v for the last 3 days, started to have cp this am.  Admitted to the ED EMT that he smoked a "little weed" today.  Pt received 324mg  ASA, zofran 4mg  and nitro SL x 3 en route.  Brought his pain from 11 to 5

## 2016-09-27 NOTE — ED Provider Notes (Signed)
[facility] DEPT Provider Note   CSN: [id number] Arrival date & time: 09/27/16  2040     History   Chief Complaint Chief Complaint  Patient presents with  . Chest Pain    HPI Matthew Wade. is a 40 y.o. male.  Matthew Kasal. is a 40 y.o. male with h/o HLD and seizures presents to ED with complaint of chest pain. Onset this morning at 8:30am, central and left sided, constant, described as a "squeezing" sensation with associated SOB and nausea. Patient given zofran, 3 NTG, and 325mg  ASA en-route by EMS. He denies fever, diaphoresis, URI sxs, changes in vision, wheezing, cough, leg swelling/pain, abdominal pain, vomiting, diarrhea, dysuria, hematuria, rash, myalgias, arthralgias, or LOC. No recent long distance travel/surgery/hospitalization, h/o blood clot, h/o cancer, hormone therapy, hemoptysis, or leg swelling. Pt does endorse recent change in physical activity - he reports punching a punching bag yesterday which he states "I haven't done in 15 years." No personal cardiac hx. Denies DM and HTN. Current smoker.       Past Medical History:  Diagnosis Date  . Hyperlipemia   . 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 Ureteral calculi 01/13/2016    Past Surgical History:  Procedure Laterality Date  . COLONOSCOPY WITH PROPOFOL N/A 02/08/2016   Procedure: COLONOSCOPY WITH PROPOFOL;  Surgeon: Manya Silvas, MD;  Location: [facility] ENDOSCOPY;  Service: Endoscopy;  Laterality: N/A;  . ESOPHAGOGASTRODUODENOSCOPY (EGD) WITH PROPOFOL N/A 02/08/2016   Procedure: ESOPHAGOGASTRODUODENOSCOPY (EGD) WITH PROPOFOL;  Surgeon: Manya Silvas, MD;  Location: [facility] ENDOSCOPY;  Service: Endoscopy;  Laterality: N/A;  . HAND SURGERY     Right "pinky" finger amputation       Home Medications    Prior to Admission medications   Medication Sig Start Date End Date Taking? Authorizing Provider  albuterol (PROAIR HFA)  108 (90 Base) MCG/ACT inhaler Inhale 2 puffs into the lungs every 6 (six) hours as needed for wheezing. Reported on 01/12/2016    Historical Provider, MD  HYDROmorphone (DILAUDID) 4 MG tablet Take 1 tablet (4 mg total) by mouth every 3 (three) hours as needed for moderate pain or severe pain. 01/13/16   Cleon Gustin, MD  pravastatin (PRAVACHOL) 20 MG tablet Take 20 mg by mouth daily.    Historical Provider, MD  promethazine (PHENERGAN) 12.5 MG tablet Take 1 tablet (12.5 mg total) by mouth every 6 (six) hours as needed for nausea or vomiting. 01/13/16   Cleon Gustin, MD  ranitidine (ZANTAC) 150 MG tablet Take 1 tablet (150 mg total) by mouth 2 (two) times daily. 01/13/16   Cleon Gustin, MD  tamsulosin (FLOMAX) 0.4 MG CAPS capsule Take 1 capsule (0.4 mg total) by mouth daily. 01/13/16   Cleon Gustin, MD    Family History History reviewed. No pertinent family history.  Social History Social History  Substance Use Topics  . Smoking status: Current Every Day Smoker    Types: Cigarettes  . Smokeless tobacco: Never Used  . Alcohol use Yes     Comment: socail     Allergies   Aspirin and Diphenhydramine   Review of Systems Review of Systems  Constitutional: Negative for chills, diaphoresis and fever.  HENT: Negative for trouble swallowing.   Eyes: Negative for visual disturbance.  Respiratory: Positive for shortness of breath. Negative for cough and wheezing.   Cardiovascular: Positive for chest  pain and palpitations ( resolved). Negative for leg swelling.  Gastrointestinal: Positive for nausea. Negative for abdominal pain, blood in stool, constipation, diarrhea and vomiting.  Genitourinary: Negative for dysuria and hematuria.  Musculoskeletal: Negative for arthralgias and myalgias.  Skin: Negative for rash.  Neurological: Negative for syncope and numbness.     Physical Exam Updated Vital Signs BP 109/79   Pulse 68   Temp 98.3 F (36.8 C) (Oral)   Resp 21    SpO2 97%   Physical Exam  Constitutional: He appears well-developed and well-nourished. No distress.  HENT:  Head: Normocephalic and atraumatic.  Mouth/Throat: Oropharynx is clear and moist. No oropharyngeal exudate.  Eyes: Conjunctivae and EOM are normal. Pupils are equal, round, and reactive to light. Right eye exhibits no discharge. Left eye exhibits no discharge. No scleral icterus.  Neck: Normal range of motion and phonation normal. Neck supple. No neck rigidity. Normal range of motion present.  Cardiovascular: Normal rate, regular rhythm, normal heart sounds and intact distal pulses.   No murmur heard. Pulmonary/Chest: Effort normal and breath sounds normal. No stridor. No respiratory distress. He has no wheezes. He has no rales. He exhibits tenderness.    Symmetric chest expansion. Respirations unlabored. No wheezing or rales. No hypoxia.  Reproducible chest wall tenderness.   Abdominal: Soft. Bowel sounds are normal. He exhibits no distension. There is no tenderness. There is no rigidity, no rebound, no guarding and no CVA tenderness.  Musculoskeletal: Normal range of motion.  Lymphadenopathy:    He has no cervical adenopathy.  Neurological: He is alert. He is not disoriented. Coordination and gait normal. GCS eye subscore is 4. GCS verbal subscore is 5. GCS motor subscore is 6.  Skin: Skin is warm and dry. He is not diaphoretic.  Psychiatric: He has a normal mood and affect. His behavior is normal.     ED Treatments / Results  Labs (all labs ordered are listed, but only abnormal results are displayed) Labs Reviewed  BASIC METABOLIC PANEL - Abnormal; Notable for the following:       Result Value   CO2 20 (*)    Glucose, Bld 105 (*)    BUN 24 (*)    All other components within normal limits  CBC WITH DIFFERENTIAL/PLATELET  Randolm Idol, ED  Randolm Idol, ED    EKG  EKG Interpretation  Date/Time:  Thursday September 27 2016 20:49:15 EST Ventricular Rate:   98 PR Interval:    QRS Duration: 102 QT Interval:  334 QTC Calculation: 427 R Axis:   83 Text Interpretation:  Sinus rhythm Probable left atrial enlargement ST elevation suggests acute pericarditis no significant change since 2016 Confirmed by GOLDSTON MD, SCOTT [phone]) on 09/27/2016 9:12:31 PM       Radiology Dg Chest 2 View  Result Date: 09/27/2016 CLINICAL DATA:  Chest pain and dyspnea EXAM: CHEST  2 VIEW COMPARISON:  05/17/2015 FINDINGS: The heart and mediastinal contours are stable. No aortic aneurysm. No pneumonic consolidation, CHF, effusion or pneumothorax. No suspicious osseous abnormalities. Minimal lower thoracic levoconvex curvature as before. IMPRESSION: No active cardiopulmonary disease. Electronically Signed   By: Niko Jakel Royalty M.D.   On: 09/27/2016 21:31    Procedures Procedures (including critical care time)  Medications Ordered in ED Medications  acetaminophen (TYLENOL) tablet 650 mg (650 mg Oral Given 09/27/16 2351)     Initial Impression / Assessment and Plan / ED Course  I have reviewed the triage vital signs and the nursing notes.  Pertinent labs &  imaging results that were available during my care of the patient were reviewed by me and considered in my medical decision making (see chart for details).  Clinical Course as of Sep 28 17  Thu Sep 27, 2016  2200 Normal cardiac silhouette. No evidence of consolidation, effusion, or PTX. No free air under diaphragm.  DG Chest 2 View [AM]    Clinical Course User Index [AM] Roxanna Mew, PA-C    Patient presents to ED with complaint of CP onset 8:30am. Patient is afebrile and non-toxic appearing in NAD. VSS.  Physical exam remarkable for reproducible left sided chest wall tenderness. Lungs CTABL. Abdomen +BS, soft, non-tender, non-distended. Will check basic labs, EKG, and CXR.   Labs grossly nml. Delta troponin nml. CXR shows no acute cardiopulmonary process. EKG shows no significant change from  previous. Heart score 3. Low suspicion for ACS. Wells score 0. PERC negative. No tachypnea, tachycardia, or hypoxia; no lower extremity swelling or calf pain. Low suspicion for PE at this time. Pain is reproducible on palpation. Suspect MSK in nature related to recent change in physical activity.   Discussed results and plan with patient. Tylenol/motrin for pain relief. Follow up with PCP in 2-3 days for re-evaluation. Return precautions provided. Pt voiced understanding and is agreeable.   Final Clinical Impressions(s) / ED Diagnoses   Final diagnoses:  Chest wall pain    New Prescriptions New Prescriptions   No medications on file     Roxanna Mew, PA-C 09/28/16 0018    Sherwood Gambler, MD 10/01/16 [phone]

## 2016-09-28 NOTE — Discharge Instructions (Signed)
Read the information below.  Your labs, EKG, and chest x-ray was re-assuring.  Your pain is reproducible on palpation, this may be musculoskeletal from your recent change in activity. Please take tylenol or motrin for pain relief.   Please call and follow up with your primary provider in the next 2-3 days for re-evaluation.  You may return to the Emergency Department at any time for worsening condition or any new symptoms that concern you. Return to ED if you develop fever, coughing up blood, persistent/worsening chest pain or shortness of breath, lower leg swelling/pain, loss of consciousness, or any other new/concerning symptoms.

## 2016-11-24 DIAGNOSIS — M24812 Other specific joint derangements of left shoulder, not elsewhere classified: Secondary | ICD-10-CM | POA: Diagnosis not present

## 2016-12-17 DIAGNOSIS — K219 Gastro-esophageal reflux disease without esophagitis: Secondary | ICD-10-CM | POA: Diagnosis not present

## 2016-12-17 DIAGNOSIS — J4530 Mild persistent asthma, uncomplicated: Secondary | ICD-10-CM | POA: Diagnosis not present

## 2016-12-17 DIAGNOSIS — Z79899 Other long term (current) drug therapy: Secondary | ICD-10-CM | POA: Diagnosis not present

## 2016-12-17 DIAGNOSIS — E7800 Pure hypercholesterolemia, unspecified: Secondary | ICD-10-CM | POA: Diagnosis not present

## 2016-12-17 DIAGNOSIS — J309 Allergic rhinitis, unspecified: Secondary | ICD-10-CM | POA: Diagnosis not present

## 2017-02-16 IMAGING — US US RENAL
1 series · 14 of 25 positions shown · non-contrast
Comparison: CT abdomen and pelvis of 12/30/2015

CLINICAL DATA: Left-sided pain for 1 week, history of kidney stones

EXAM:
RENAL / URINARY TRACT ULTRASOUND COMPLETE

[Series 1: us renal · 0.20mm/px · 14 of 72 slices shown]
[im 1/72]
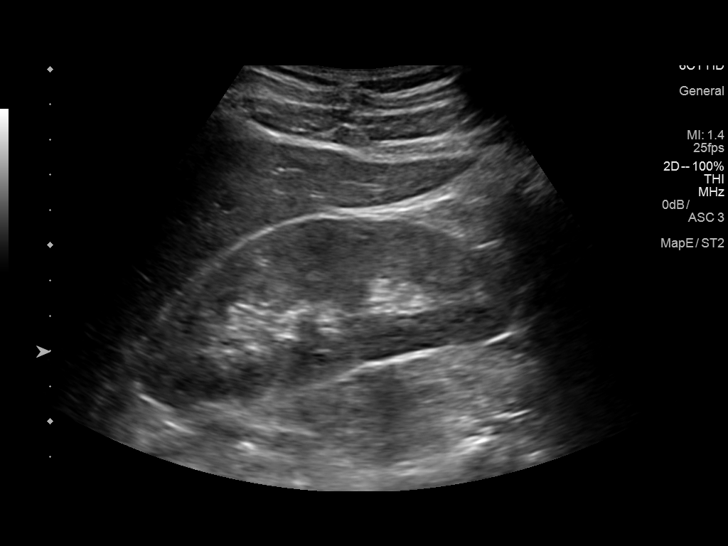
[im 6/72]
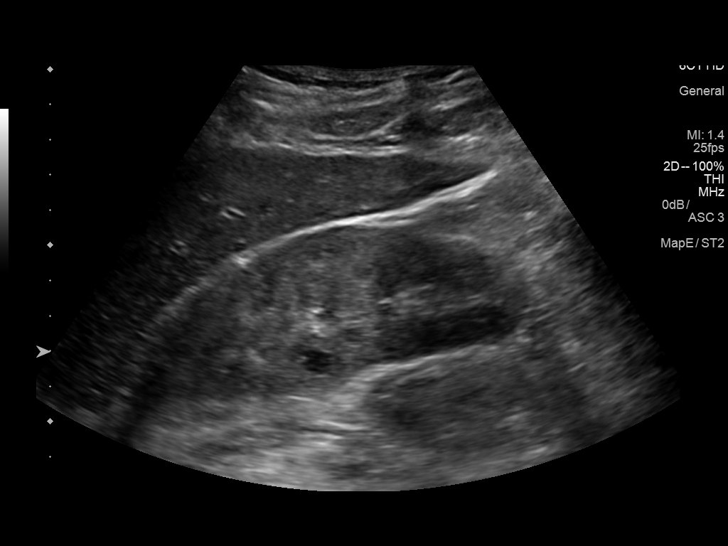
[im 12/72]
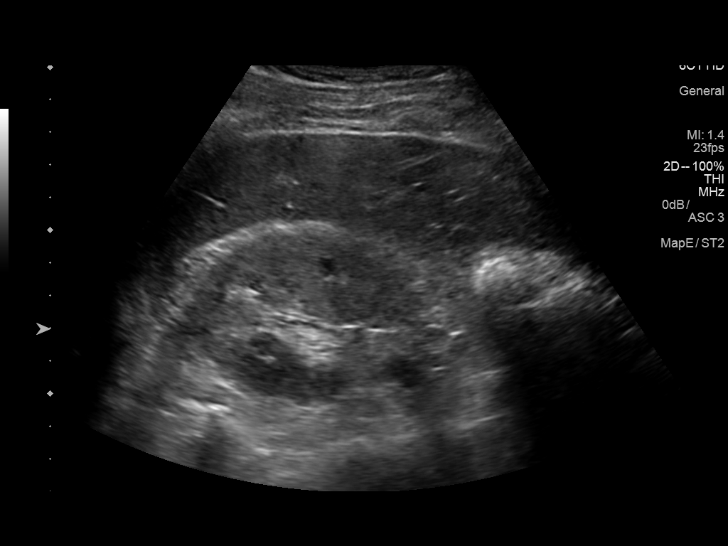
[im 18/72]
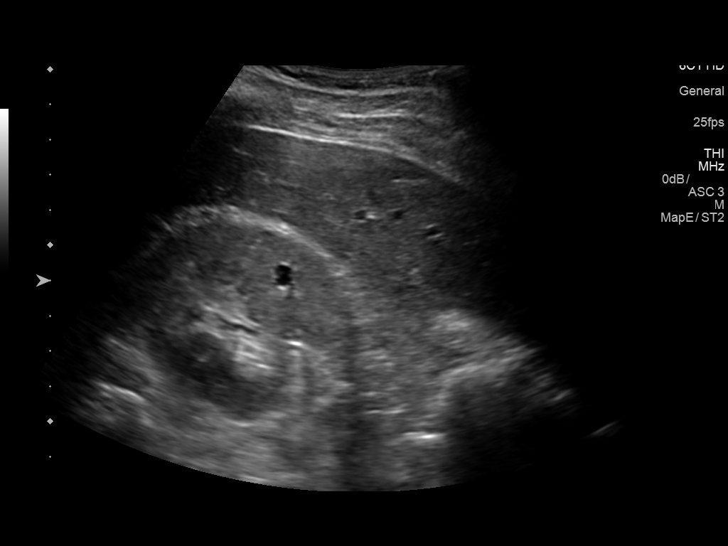
[im 24/72]
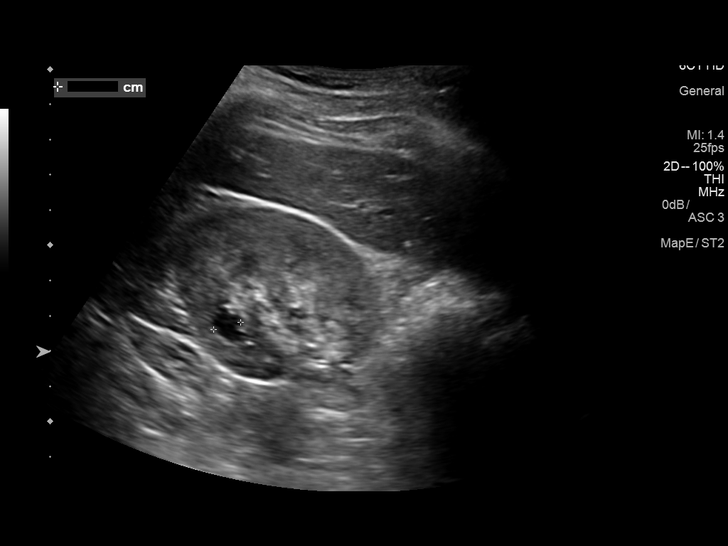
[im 27/72]
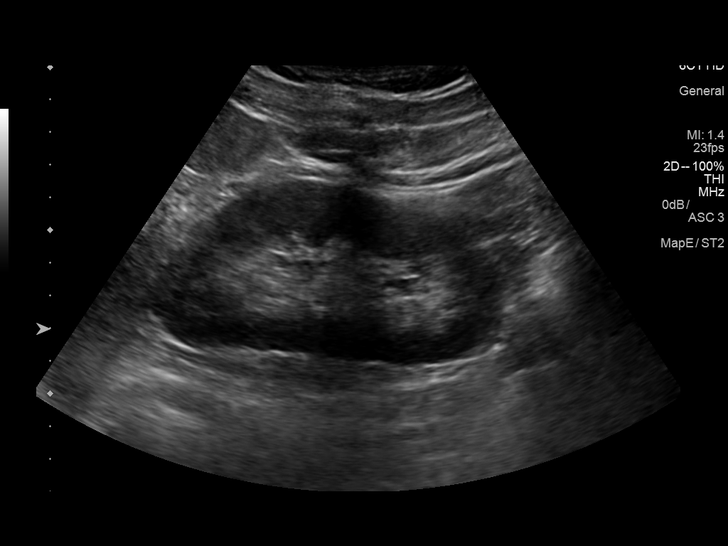
[im 33/72]
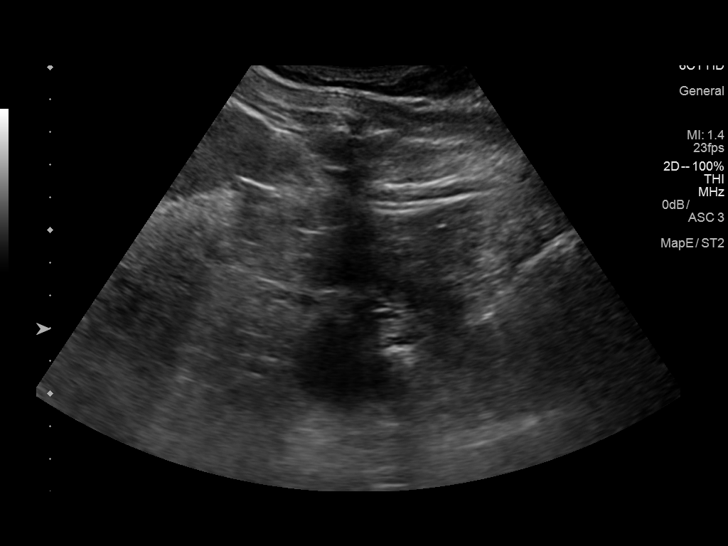
[im 39/72]
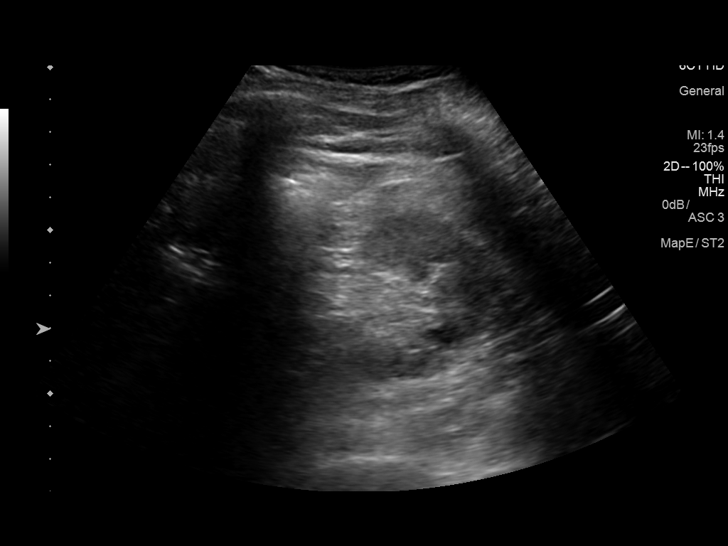
[im 45/72]
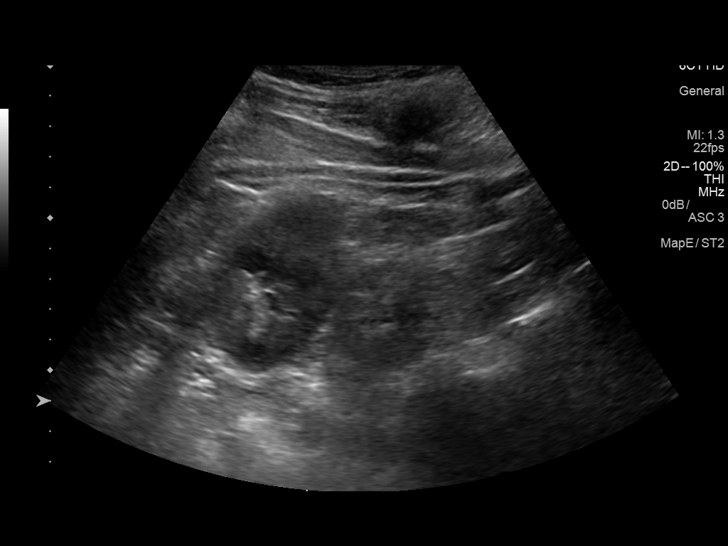
[im 48/72]
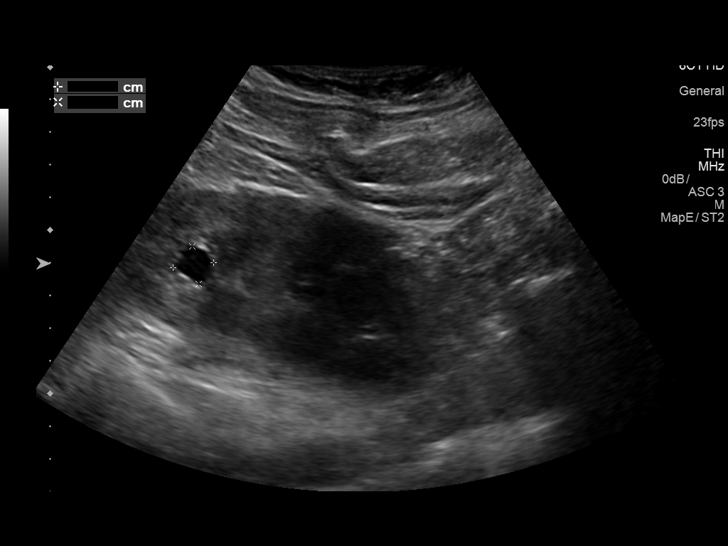
[im 54/72]
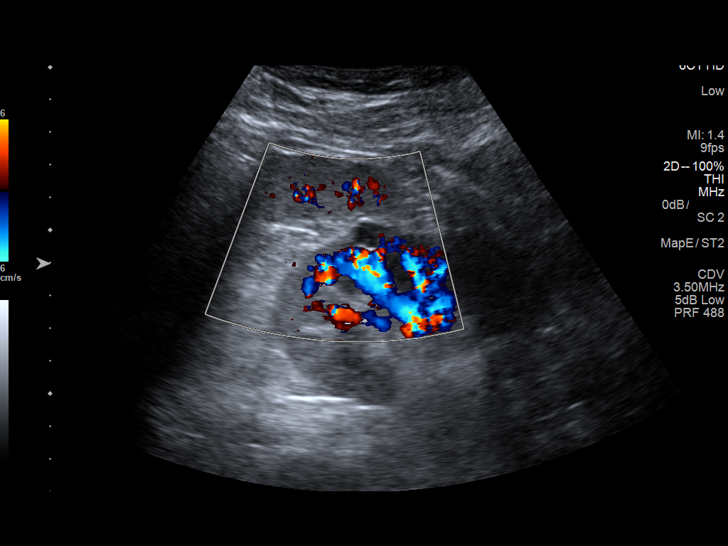
[im 60/72]
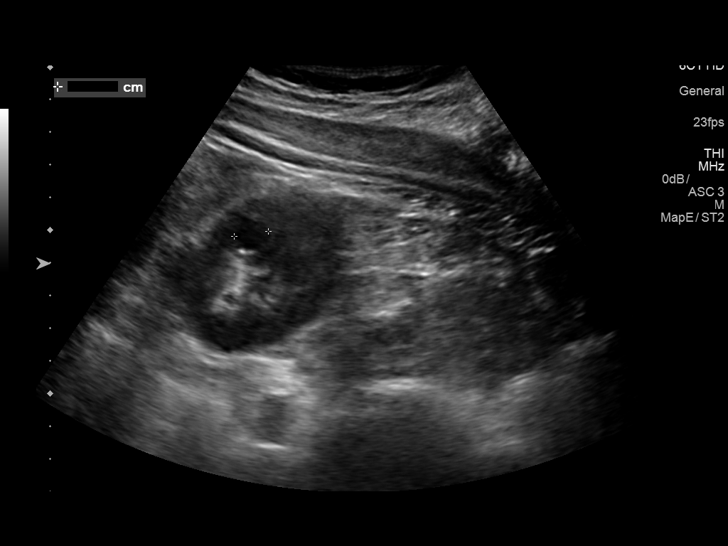
[im 66/72]
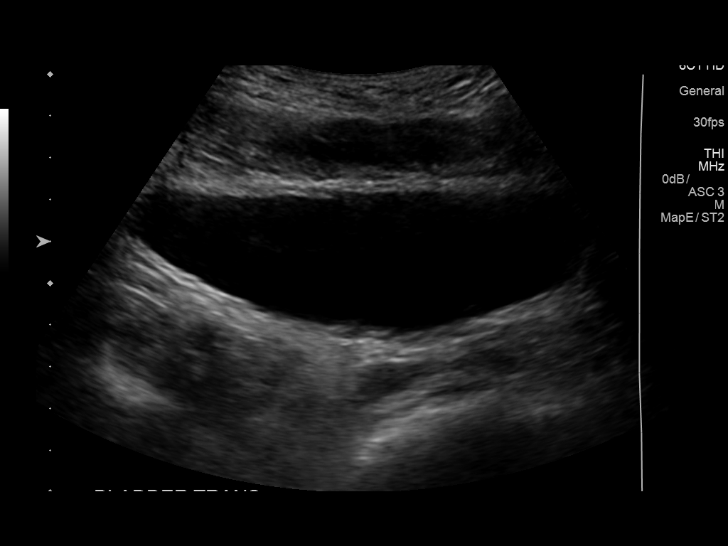
[im 72/72]
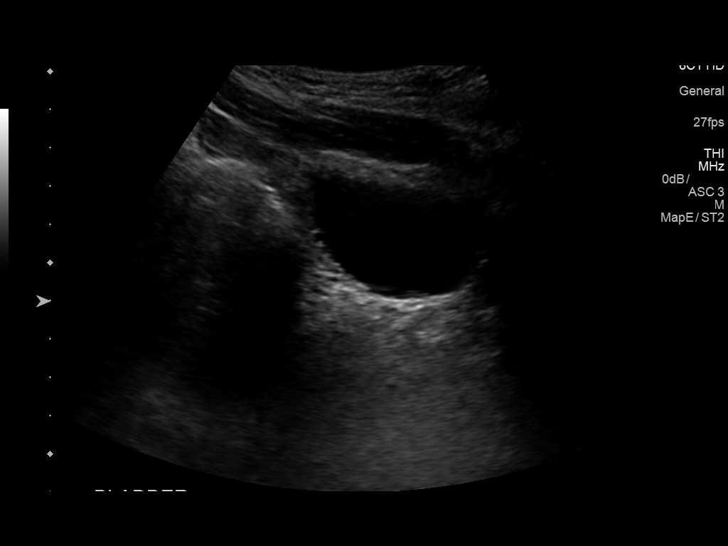

[14 of 25 positions shown; findings below may reference images not displayed]

FINDINGS: Right Kidney:

Length: 11.4 cm.. No hydronephrosis is seen. The small hypoechoic
structures are present consistent with small cysts. The echogenicity
of the right kidney may be somewhat increased in correlation with
renal laboratory values is recommended.

Left Kidney:

Length: 11.5 cm.. Small left renal cysts also are noted. An
extrarenal pelvis is present. The calculus noted by CT within the
left renal pelvis is unchanged measuring 1.1 cm.

Bladder:

The urinary bladder is not well distended but is unremarkable.
IMPRESSION: 1. Nonobstructing 11 mm left renal pelvic calculus.
2. Small renal cysts bilaterally.
3. Somewhat echogenic right renal parenchyma. Correlate with renal
laboratory values.

## 2017-04-09 IMAGING — DX DG ABDOMEN 1V
1 series · 1 of 1 positions shown · non-contrast
Comparison: Multiple exams, including 01/02/2016

CLINICAL DATA: Preoperative for left renal calculus removal.

EXAM:
ABDOMEN - 1 VIEW

[abdomen kub]
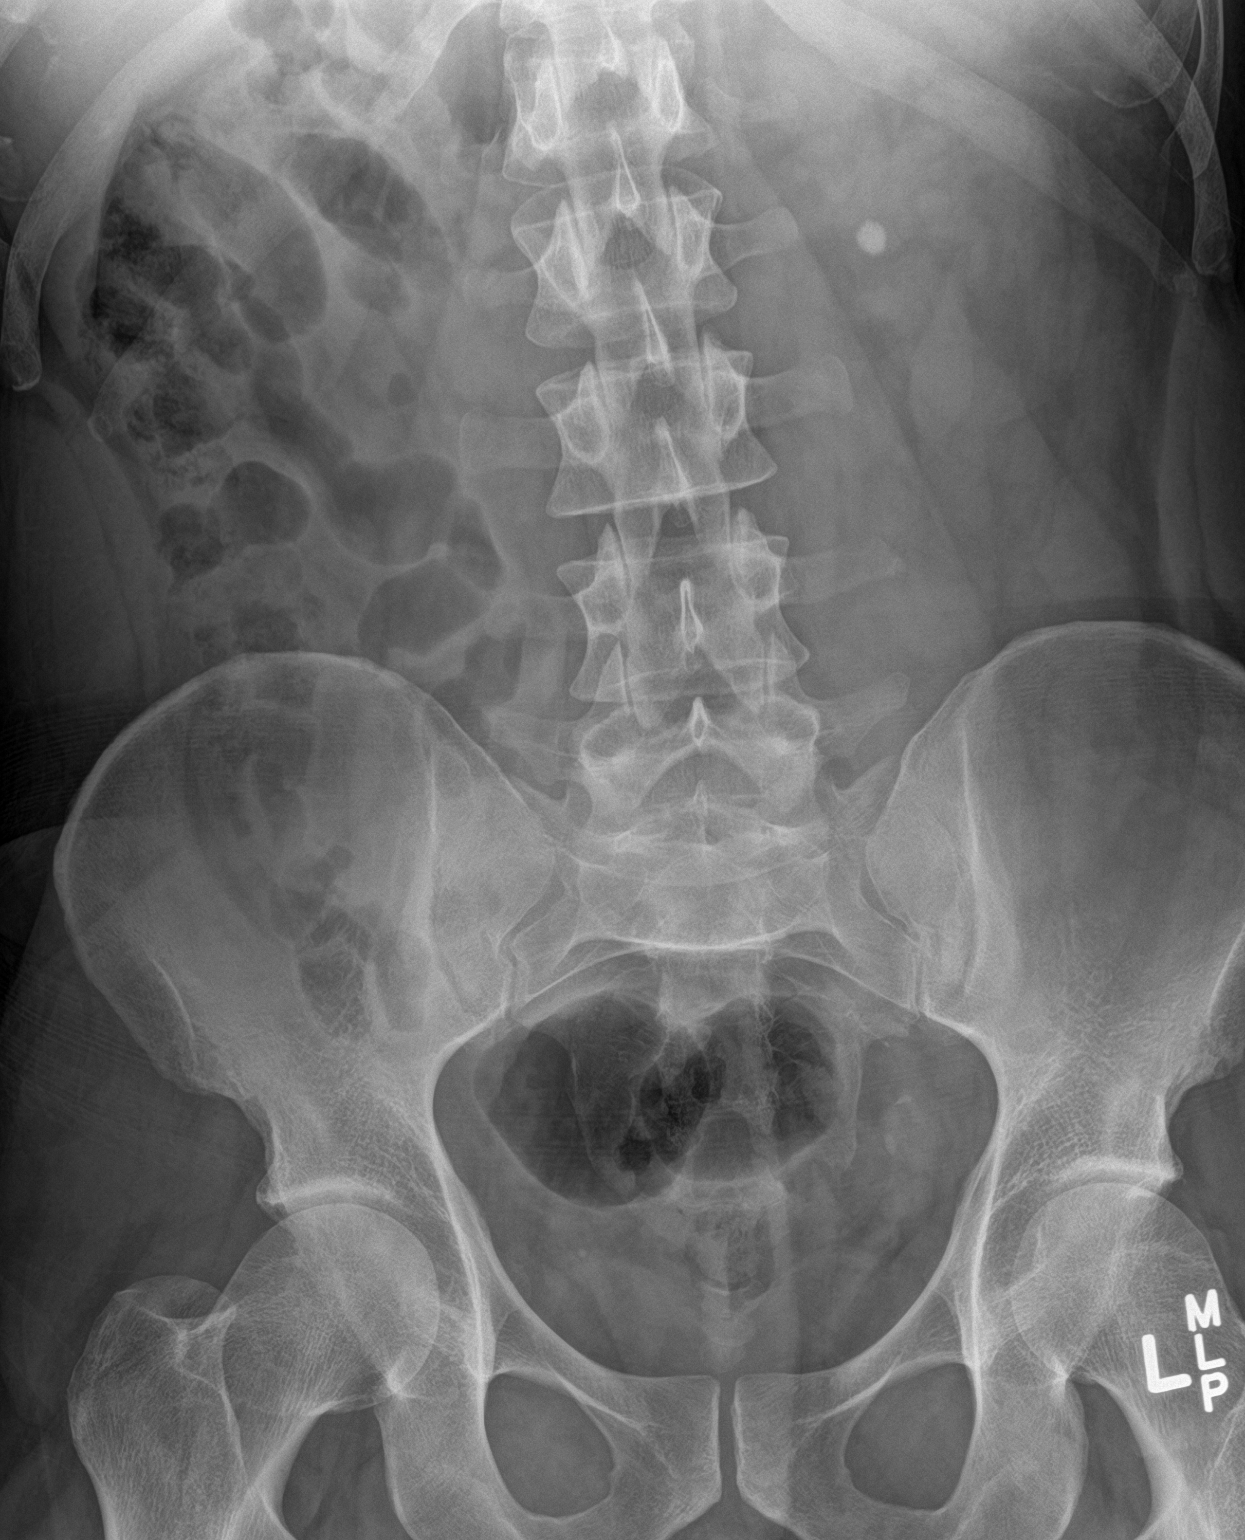

[1 of 1 positions shown; findings below may reference images not displayed]

FINDINGS: Calcification projecting over the left renal hilum, 1.0 by 0.7 cm.

Vascular calcifications in the anatomic pelvis. No other stones are
seen.
IMPRESSION: 1. Stable 1.0 cm calculus projecting over the left renal hilum.

## 2017-04-09 IMAGING — CT CT ABD-PELV W/O CM
2 of 4 series · 16 of 46 positions shown, 18 images · non-contrast
Comparison: 12/30/2015

CLINICAL DATA: Lithotripsy today.  Severe abdominal pain.

EXAM:
CT ABDOMEN AND PELVIS WITHOUT CONTRAST
TECHNIQUE: Multidetector CT imaging of the abdomen and pelvis was performed
following the standard protocol without IV contrast.

[Series 2: abd/pel w/o · axial · non-contrast · 0.74mm/px · z∈[+951,+1381]mm · 13 of 96 slices shown, 15 images]
[im 5/96  soft-tissue]
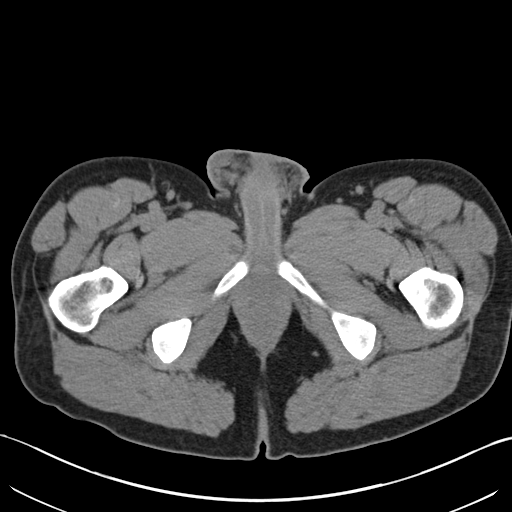
[im 5/96  bone]
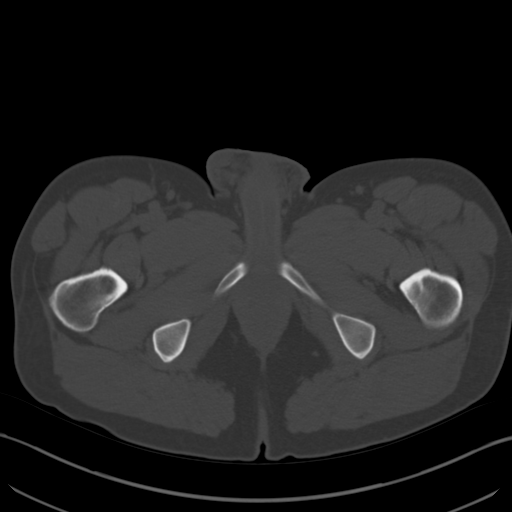
[im 13/96  soft-tissue]
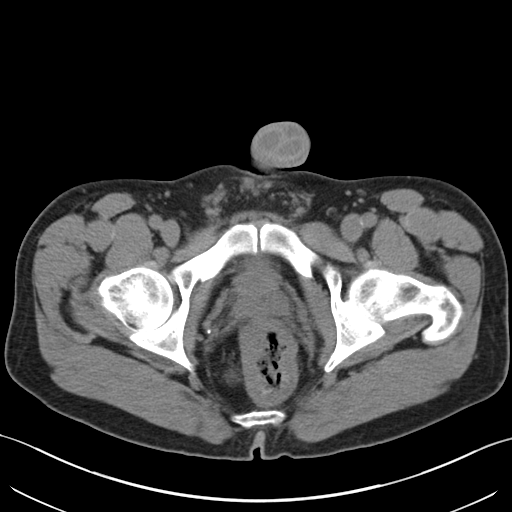
[im 22/96  soft-tissue]
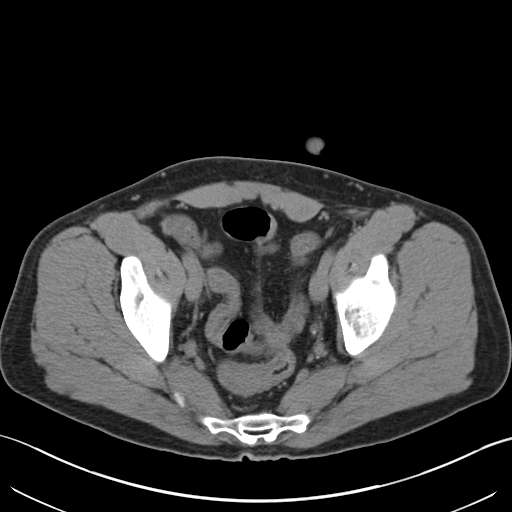
[im 26/96  soft-tissue]
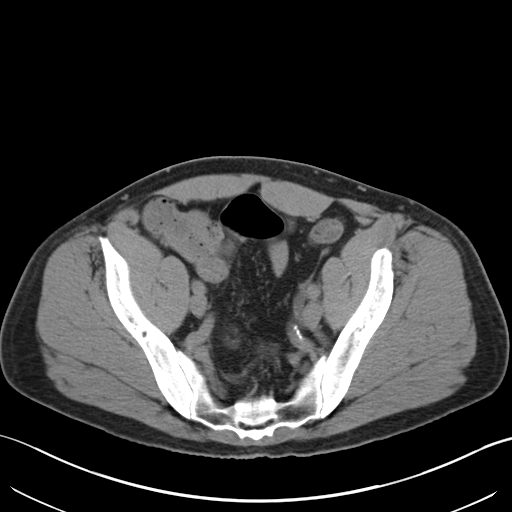
[im 35/96  soft-tissue]
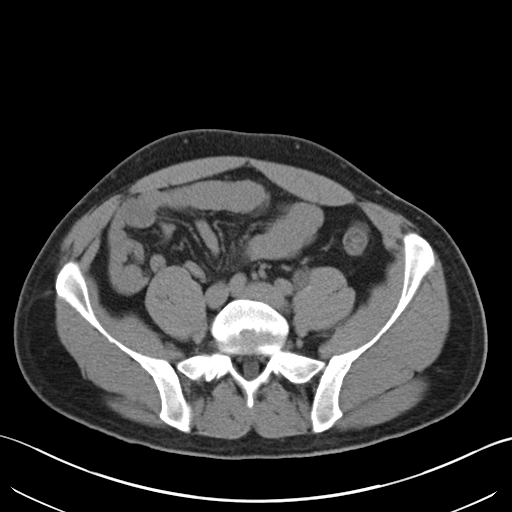
[im 39/96  soft-tissue]
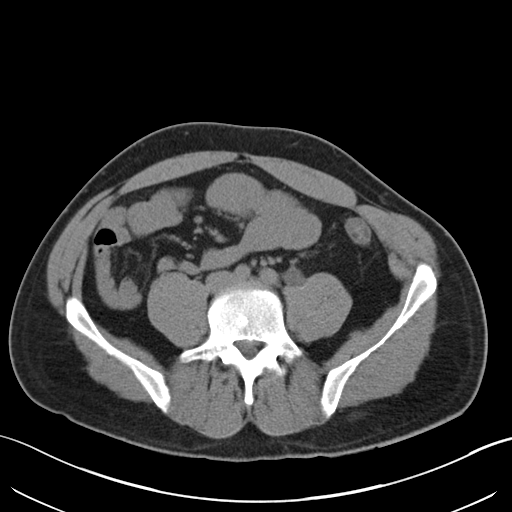
[im 48/96  soft-tissue]
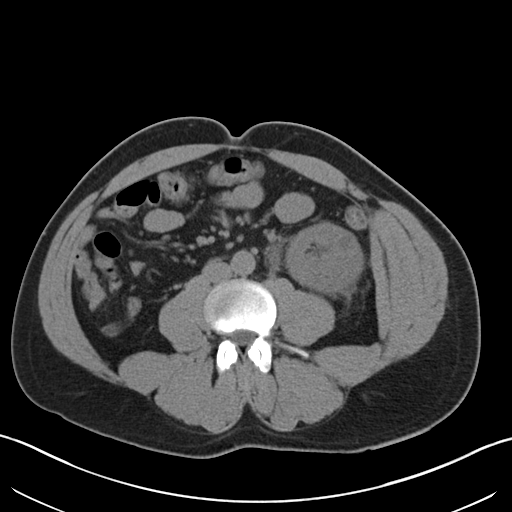
[im 57/96  soft-tissue]
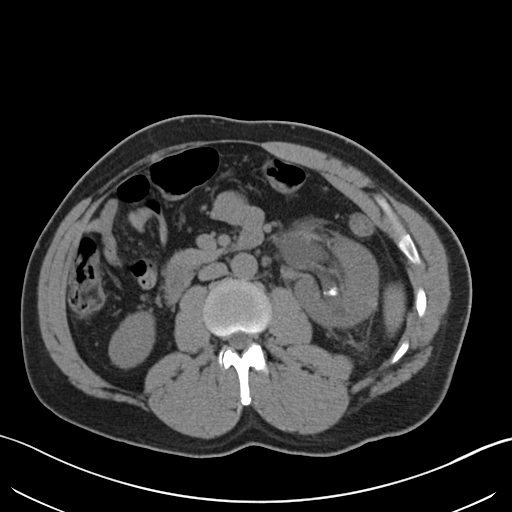
[im 61/96  soft-tissue]
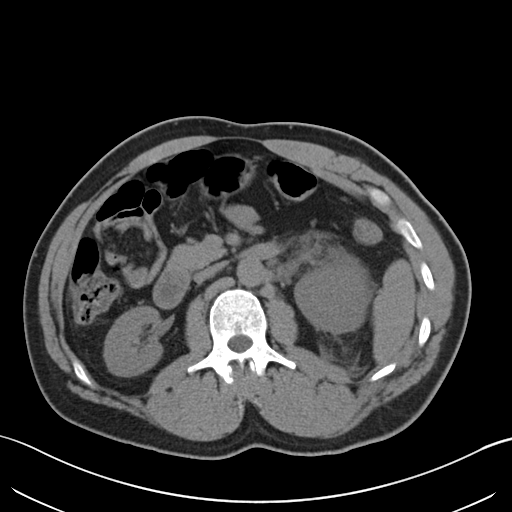
[im 61/96  bone]
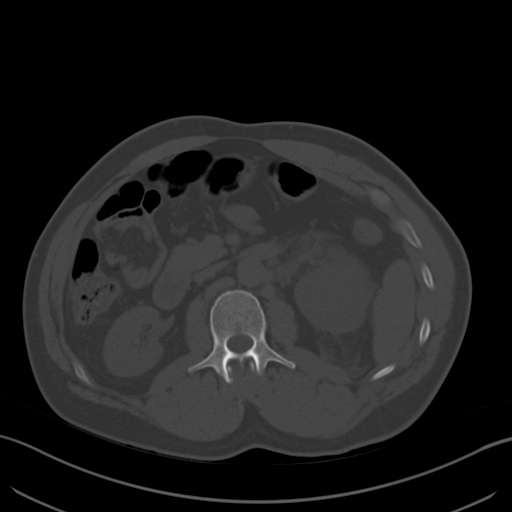
[im 70/96  soft-tissue]
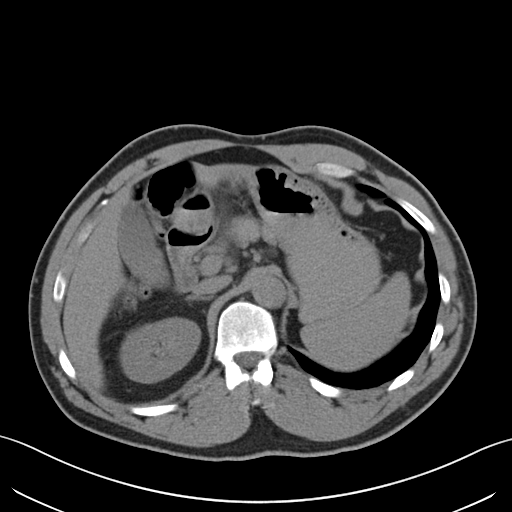
[im 74/96  soft-tissue]
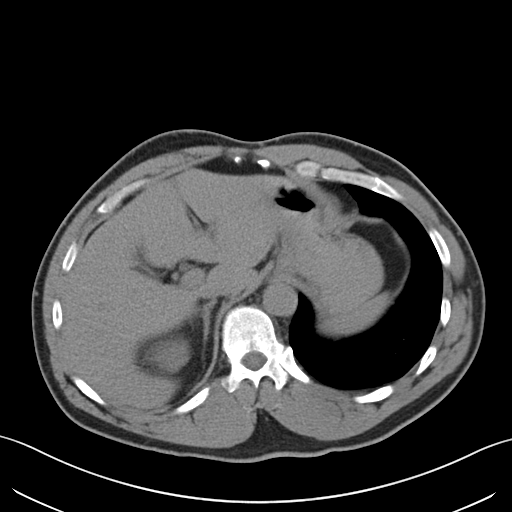
[im 83/96  soft-tissue]
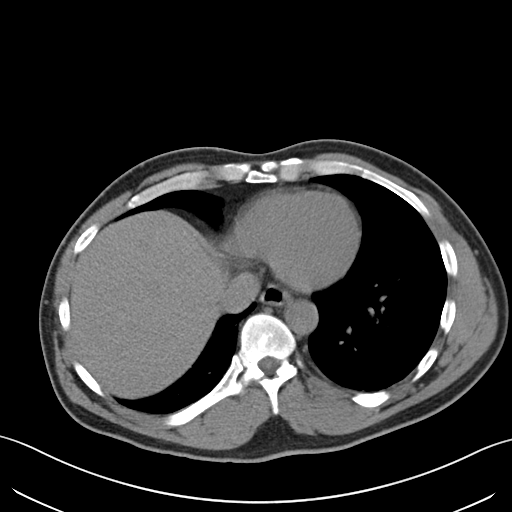
[im 91/96  soft-tissue]
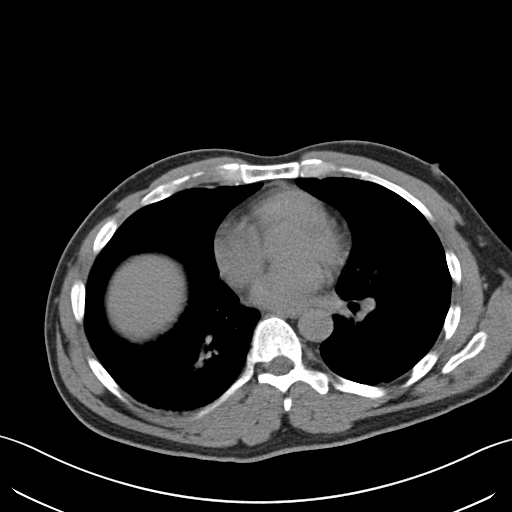

[Series 5: coronal · coronal · 0.71mm/px · 3 of 83 slices shown]
[im 28/83  soft-tissue]
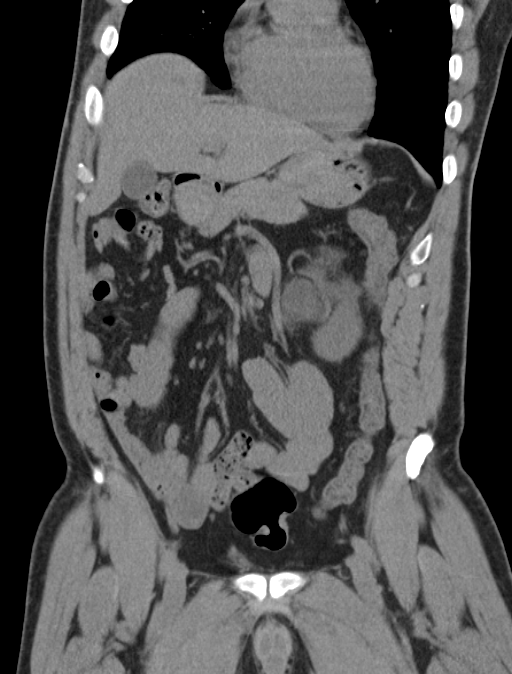
[im 37/83  soft-tissue]
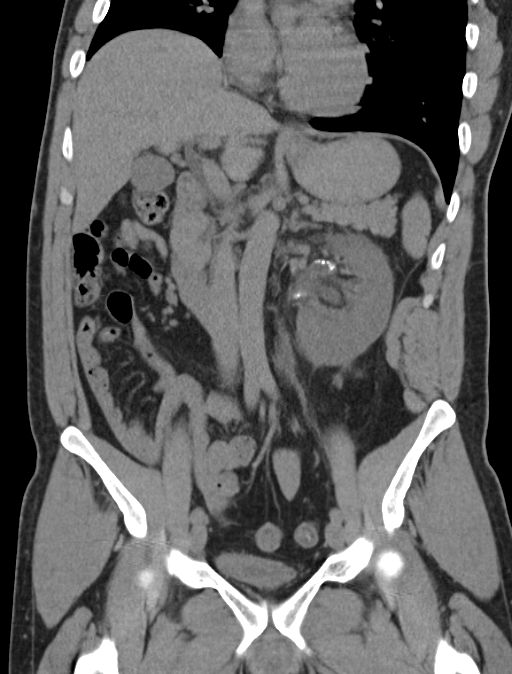
[im 46/83  soft-tissue]
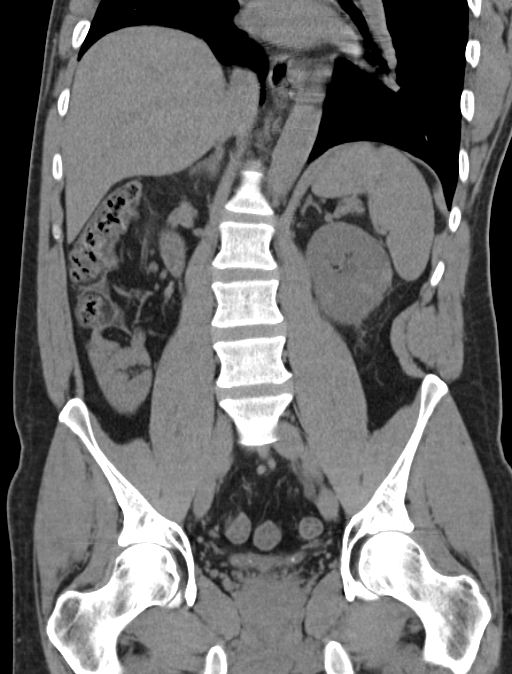

[16 of 46 positions shown; findings below may reference images not displayed]

FINDINGS: Lower chest and abdominal wall:  No contributory findings.

Hepatobiliary: No focal liver abnormality.No evidence of biliary
obstruction or stone.

Pancreas: Unremarkable.

Spleen: Unremarkable.

Adrenals/Urinary Tract:  Negative adrenals.

Recent shock wave lithotripsy with fragmentation of previously seen
stone. Numerous fragments are present in the left intrarenal
collecting system. Largest conglomerate of these intrarenal calculi
measure 3 mm in the renal pelvis. There are 5 distal left ureteral
calculi including a 6 x2 mm rod like stone at the ureteral vesicular
junction. Moderate left hydroureteronephrosis and new asymmetric
left perinephric edema. The edema could be from obstruction or
lithotripsy. Subtle patchy left cortical high density is new and
attributed to hemorrhage. No dominant or subcapsular hematoma is
seen. No right hydronephrosis. Collapsed bladder.

Reproductive:No pathologic findings.

Stomach/Bowel: Circumferential rectal thickening which has been
present since at least 0049 abdominal CT.

Vascular/Lymphatic: No acute vascular abnormality. No mass or
adenopathy.

Peritoneal: No ascites or pneumoperitoneum.

Musculoskeletal: No acute abnormalities.
IMPRESSION: 1. High-grade left urinary obstruction from multiple distal ureteral
stone fragments. The largest measures 6 x 2 mm at the UVJ.
2. Scattered small areas of left renal cortical hemorrhage but no
dominant or subcapsular hematoma.
3. Nonspecific rectal wall thickening, likely present since [DATE].

## 2017-04-29 DIAGNOSIS — J3089 Other allergic rhinitis: Secondary | ICD-10-CM | POA: Diagnosis not present

## 2017-04-29 DIAGNOSIS — J3081 Allergic rhinitis due to animal (cat) (dog) hair and dander: Secondary | ICD-10-CM | POA: Diagnosis not present

## 2017-04-29 DIAGNOSIS — J301 Allergic rhinitis due to pollen: Secondary | ICD-10-CM | POA: Diagnosis not present

## 2017-04-29 DIAGNOSIS — J4540 Moderate persistent asthma, uncomplicated: Secondary | ICD-10-CM | POA: Diagnosis not present

## 2017-05-03 DIAGNOSIS — J3089 Other allergic rhinitis: Secondary | ICD-10-CM | POA: Diagnosis not present

## 2017-05-03 DIAGNOSIS — J301 Allergic rhinitis due to pollen: Secondary | ICD-10-CM | POA: Diagnosis not present

## 2017-05-03 DIAGNOSIS — J3081 Allergic rhinitis due to animal (cat) (dog) hair and dander: Secondary | ICD-10-CM | POA: Diagnosis not present

## 2017-08-09 DIAGNOSIS — J3089 Other allergic rhinitis: Secondary | ICD-10-CM | POA: Diagnosis not present

## 2017-08-09 DIAGNOSIS — J301 Allergic rhinitis due to pollen: Secondary | ICD-10-CM | POA: Diagnosis not present

## 2017-08-09 DIAGNOSIS — J3081 Allergic rhinitis due to animal (cat) (dog) hair and dander: Secondary | ICD-10-CM | POA: Diagnosis not present

## 2017-12-25 ENCOUNTER — Ambulatory Visit: Payer: Medicare Other | Admitting: Family Medicine

## 2018-01-06 ENCOUNTER — Ambulatory Visit (INDEPENDENT_AMBULATORY_CARE_PROVIDER_SITE_OTHER): Payer: Medicare Other | Admitting: Student

## 2018-01-06 ENCOUNTER — Ambulatory Visit: Payer: Medicare Other | Admitting: Family Medicine

## 2018-01-06 ENCOUNTER — Other Ambulatory Visit: Payer: Self-pay | Admitting: Student

## 2018-01-06 ENCOUNTER — Encounter: Payer: Self-pay | Admitting: Student

## 2018-01-06 ENCOUNTER — Other Ambulatory Visit: Payer: Self-pay

## 2018-01-06 VITALS — BP 120/78 | HR 81 | Temp 98.4°F | Ht 68.0 in | Wt 181.0 lb

## 2018-01-06 DIAGNOSIS — E785 Hyperlipidemia, unspecified: Secondary | ICD-10-CM

## 2018-01-06 DIAGNOSIS — Z0001 Encounter for general adult medical examination with abnormal findings: Secondary | ICD-10-CM

## 2018-01-06 DIAGNOSIS — Z1329 Encounter for screening for other suspected endocrine disorder: Secondary | ICD-10-CM | POA: Diagnosis not present

## 2018-01-06 DIAGNOSIS — H548 Legal blindness, as defined in USA: Secondary | ICD-10-CM | POA: Diagnosis not present

## 2018-01-06 DIAGNOSIS — G5603 Carpal tunnel syndrome, bilateral upper limbs: Secondary | ICD-10-CM

## 2018-01-06 DIAGNOSIS — Z79899 Other long term (current) drug therapy: Secondary | ICD-10-CM | POA: Diagnosis not present

## 2018-01-06 DIAGNOSIS — Z130 Encounter for screening for diseases of the blood and blood-forming organs and certain disorders involving the immune mechanism: Secondary | ICD-10-CM

## 2018-01-06 DIAGNOSIS — E7800 Pure hypercholesterolemia, unspecified: Secondary | ICD-10-CM | POA: Insufficient documentation

## 2018-01-06 DIAGNOSIS — Z23 Encounter for immunization: Secondary | ICD-10-CM | POA: Diagnosis present

## 2018-01-06 DIAGNOSIS — Z800 Family history of malignant neoplasm of digestive organs: Secondary | ICD-10-CM | POA: Diagnosis not present

## 2018-01-06 DIAGNOSIS — E782 Mixed hyperlipidemia: Secondary | ICD-10-CM | POA: Insufficient documentation

## 2018-01-06 DIAGNOSIS — Z0000 Encounter for general adult medical examination without abnormal findings: Secondary | ICD-10-CM

## 2018-01-06 NOTE — Patient Instructions (Signed)
It was great seeing you today!  Pain in your thumbs: This is likely due to carpal tunnel syndrome.  I recommend getting a wrist brace and wearing it at night.  You can also try Aleve 2 tablets twice a day as needed for pain.  Tobacco use: We strongly recommend quitting smoking. Call 1800-QUIT-NOW for help with stopping smoking.  Health Maintenance, Male A healthy lifestyle and preventive care is important for your health and wellness. Ask your health care provider about what schedule of regular examinations is right for you. What should I know about weight and diet? Eat a Healthy Diet  Eat plenty of vegetables, fruits, whole grains, low-fat dairy products, and lean protein.  Do not eat a lot of foods high in solid fats, added sugars, or salt.  Maintain a Healthy Weight Regular exercise can help you achieve or maintain a healthy weight. You should:  Do at least 150 minutes of exercise each week. The exercise should increase your heart rate and make you sweat (moderate-intensity exercise).  Do strength-training exercises at least twice a week.  Watch Your Levels of Cholesterol and Blood Lipids  Have your blood tested for lipids and cholesterol every 5 years starting at 40 years of age. If you are at high risk for heart disease, you should start having your blood tested when you are 40 years old. You may need to have your cholesterol levels checked more often if: ? Your lipid or cholesterol levels are high. ? You are older than 40 years of age. ? You are at high risk for heart disease.  What should I know about cancer screening? Many types of cancers can be detected early and may often be prevented. Lung Cancer  You should be screened every year for lung cancer if: ? You are a current smoker who has smoked for at least 30 years. ? You are a former smoker who has quit within the past 15 years.  Talk to your health care provider about your screening options, when you should start  screening, and how often you should be screened.  Colorectal Cancer  Routine colorectal cancer screening usually begins at 40 years of age and should be repeated every 5-10 years until you are 40 years old. You may need to be screened more often if early forms of precancerous polyps or small growths are found. Your health care provider may recommend screening at an earlier age if you have risk factors for colon cancer.  Your health care provider may recommend using home test kits to check for hidden blood in the stool.  A small camera at the end of a tube can be used to examine your colon (sigmoidoscopy or colonoscopy). This checks for the earliest forms of colorectal cancer.  Prostate and Testicular Cancer  Depending on your age and overall health, your health care provider may do certain tests to screen for prostate and testicular cancer.  Talk to your health care provider about any symptoms or concerns you have about testicular or prostate cancer.  Skin Cancer  Check your skin from head to toe regularly.  Tell your health care provider about any new moles or changes in moles, especially if: ? There is a change in a mole's size, shape, or color. ? You have a mole that is larger than a pencil eraser.  Always use sunscreen. Apply sunscreen liberally and repeat throughout the day.  Protect yourself by wearing long sleeves, pants, a wide-brimmed hat, and sunglasses when outside.  What  should I know about heart disease, diabetes, and high blood pressure?  If you are 25-68 years of age, have your blood pressure checked every 3-5 years. If you are 2 years of age or older, have your blood pressure checked every year. You should have your blood pressure measured twice-once when you are at a hospital or clinic, and once when you are not at a hospital or clinic. Record the average of the two measurements. To check your blood pressure when you are not at a hospital or clinic, you can use: ? An  automated blood pressure machine at a pharmacy. ? A home blood pressure monitor.  Talk to your health care provider about your target blood pressure.  If you are between 47-50 years old, ask your health care provider if you should take aspirin to prevent heart disease.  Have regular diabetes screenings by checking your fasting blood sugar level. ? If you are at a normal weight and have a low risk for diabetes, have this test once every three years after the age of 27. ? If you are overweight and have a high risk for diabetes, consider being tested at a younger age or more often.  A one-time screening for abdominal aortic aneurysm (AAA) by ultrasound is recommended for men aged 41-75 years who are current or former smokers. What should I know about preventing infection? Hepatitis B If you have a higher risk for hepatitis B, you should be screened for this virus. Talk with your health care provider to find out if you are at risk for hepatitis B infection. Hepatitis C Blood testing is recommended for:  Everyone born from [birth year] through 1963.  Anyone with known risk factors for hepatitis C.  Sexually Transmitted Diseases (STDs)  You should be screened each year for STDs including gonorrhea and chlamydia if: ? You are sexually active and are younger than 40 years of age. ? You are older than 40 years of age and your health care provider tells you that you are at risk for this type of infection. ? Your sexual activity has changed since you were last screened and you are at an increased risk for chlamydia or gonorrhea. Ask your health care provider if you are at risk.  Talk with your health care provider about whether you are at high risk of being infected with HIV. Your health care provider may recommend a prescription medicine to help prevent HIV infection.  What else can I do?  Schedule regular health, dental, and eye exams.  Stay current with your vaccines (immunizations).  Do not use  any tobacco products, such as cigarettes, chewing tobacco, and e-cigarettes. If you need help quitting, ask your health care provider.  Limit alcohol intake to no more than 2 drinks per day. One drink equals 12 ounces of beer, 5 ounces of wine, or 1 ounces of hard liquor.  Do not use street drugs.  Do not share needles.  Ask your health care provider for help if you need support or information about quitting drugs.  Tell your health care provider if you often feel depressed.  Tell your health care provider if you have ever been abused or do not feel safe at home. This information is not intended to replace advice given to you by your health care provider. Make sure you discuss any questions you have with your health care provider. Document Released: 04/12/2008 Document Revised: 06/13/2016 Document Reviewed: 07/19/2015 Elsevier Interactive Patient Education  [employer].

## 2018-01-06 NOTE — Progress Notes (Signed)
Subjective:   Chief Complaint  Patient presents with  . Establish Care   HPI Matthew Wade is a 42 y.o. old male here  for annual exam.  Concern today: -Numbness and pain in his hands. This has been going on for a while. Reports repeating hand use.  Changes in his/her health in the last 12 months: no Occupation: on disability. Legally blind Wears seatbelt: yes.    The patient has regular exercise: no.   Enough vegetables and fruits: no.  Smokes cigarette: yes Drinks EtOH: yes Drug use: yes. Marijuana Patient takes ASA: yes The patient is sexually active.  Domestic violence: no.  Advance directive: no. History of depression: no.  Patient dental home: yes  Immunizations  Needs influenza vaccine: yes.  Needs Shingrix (all >62yrs of age): not applicable.  Needs Tdap: reports this on 01/08/2015 Needs Pneumococcal: reports in 2013 Screening Need colon cancer screening: no.  Father with colon cancer at a young age.  Patient had a colonoscopy 2 years ago that showed diminutive sessile polyp in sigmoid colon that was resected.  Pathology without dysplasia.  Follow-up colonoscopy in 2017 sooner if needed Need STI Screening: no. Fall in the last 12 months:no  PMH/Problem List: has Ureteral calculi; Allergic rhinitis; Asthma; Back pain; Esophageal reflux; Kidney stones; Knee pain; Pure hypercholesterolemia; Anesthesia of skin; Annual physical exam; Hyperlipidemia; Bilateral carpal tunnel syndrome; Family history of colon cancer; and Legally blind on their problem list.   has a past medical history of Asthma (06/15/2014), Hyperlipemia, and Seizures ([place]).  [facility]  Family History  Problem Relation Age of Onset  . Colon cancer Father 62   Family history of heart disease before age of 60 yrs: no. Family history of stroke: no. Family history of cancer: yes.  SH Social History   Tobacco Use  . Smoking status: Current Every Day Smoker    Packs/day: 0.50    Years: 14.00    Pack  years: 7.00    Types: Cigarettes  . Smokeless tobacco: Never Used  Substance Use Topics  . Alcohol use: Yes    Comment: socail  . Drug use: Yes    Types: Marijuana    Comment: years ago     Review of Systems  Constitutional: Negative for diaphoresis, fatigue, fever and unexpected weight change.  HENT: Negative for congestion, hearing loss, trouble swallowing and voice change.   Eyes: Negative for visual disturbance.  Respiratory: Negative for cough, chest tightness and shortness of breath.   Cardiovascular: Negative for chest pain, palpitations and leg swelling.  Gastrointestinal: Negative for abdominal pain, blood in stool and diarrhea.  Endocrine: Negative for cold intolerance, heat intolerance, polydipsia, polyphagia and polyuria.  Genitourinary: Negative for dysuria, frequency, hematuria and scrotal swelling.  Musculoskeletal: Positive for arthralgias. Negative for myalgias.       In his hands  Skin: Negative for rash.  Neurological: Positive for numbness. Negative for dizziness, light-headedness and headaches.       In his thumbs over palmar aspect  Hematological: Negative for adenopathy. Does not bruise/bleed easily.  Psychiatric/Behavioral: Negative for dysphoric mood. The patient is not nervous/anxious.        Objective:   Physical Exam Vitals:   01/06/18 1532  BP: 120/78  Pulse: 81  Temp: 98.4 F (36.9 C)  TempSrc: Oral  SpO2: 98%  Weight: 181 lb (82.1 kg)  Height: 5\' 8"  (1.727 m)   Body mass index is 27.52 kg/m.  GEN: appears well, no apparent distress. Head: normocephalic and atraumatic  Eyes: conjunctiva without injection, sclera anicteric Oropharynx: mmm without erythema or exudation HEM: negative for cervical or periauricular lymphadenopathies CVS: RRR, nl s1 & s2, no murmurs, no edema,  2+ DP & PT bil RESP: no IWOB, good air movement bilaterally, CTAB GI: BS present & normal, soft, NTND, no guarding, no rebound, no mass GU: no suprapubic or CVA  tenderness MSK: no focal tenderness or notable swelling SKIN: no apparent skin lesion ENDO: negative thyromegally NEURO: alert and oiented appropriately, no gross deficits  PSYCH: euthymic mood with congruent affect    Assessment & Plan:  1. Annual physical exam: History, medication and allergies updated.  Up-to-date on his immunizations.  Discussed about general health maintenance including diet and exercise and gave him handout.  Will obtain basic labs including CBC, CMP, lipid panel and TSH today.  Follow-up in 1 year or sooner if needed.  2. Bilateral carpal tunnel syndrome: History and exam suggestive for carpal tunnel syndrome.  Pain improves with shaking and wringing hands.  He has positive phallen and Durkan sign on exam, more on the right.  We will try conservative management including wrist brace and NSAIDs as needed for pain.  3. Screening for deficiency anemia - CBC with Differential/Platelet  4. Hyperlipidemia, unspecified hyperlipidemia type: continue pravastatin - Lipid panel  5. Screening for thyroid disorder - TSH  6. Need for immunization against influenza - Flu Vaccine QUAD 36+ mos IM   Wendee Beavers PGY-3 Pager [phone] 01/07/18  8:20 AM

## 2018-01-07 ENCOUNTER — Encounter: Payer: Self-pay | Admitting: Student

## 2018-01-07 ENCOUNTER — Telehealth: Payer: Self-pay | Admitting: Student

## 2018-01-07 ENCOUNTER — Other Ambulatory Visit: Payer: Self-pay | Admitting: Student

## 2018-01-07 DIAGNOSIS — Z0000 Encounter for general adult medical examination without abnormal findings: Secondary | ICD-10-CM | POA: Insufficient documentation

## 2018-01-07 DIAGNOSIS — E7800 Pure hypercholesterolemia, unspecified: Secondary | ICD-10-CM | POA: Insufficient documentation

## 2018-01-07 DIAGNOSIS — G5603 Carpal tunnel syndrome, bilateral upper limbs: Secondary | ICD-10-CM | POA: Insufficient documentation

## 2018-01-07 DIAGNOSIS — E785 Hyperlipidemia, unspecified: Secondary | ICD-10-CM

## 2018-01-07 DIAGNOSIS — R7989 Other specified abnormal findings of blood chemistry: Secondary | ICD-10-CM

## 2018-01-07 DIAGNOSIS — H548 Legal blindness, as defined in USA: Secondary | ICD-10-CM | POA: Insufficient documentation

## 2018-01-07 DIAGNOSIS — Z800 Family history of malignant neoplasm of digestive organs: Secondary | ICD-10-CM | POA: Insufficient documentation

## 2018-01-07 LAB — CMP14+EGFR
ALBUMIN: 4.6 g/dL (ref 3.5–5.5)
ALK PHOS: 74 IU/L (ref 39–117)
ALT: 20 IU/L (ref 0–44)
AST: 19 IU/L (ref 0–40)
Albumin/Globulin Ratio: 1.6 (ref 1.2–2.2)
BUN/Creatinine Ratio: 19 (ref 9–20)
BUN: 25 mg/dL — AB (ref 6–24)
Bilirubin Total: 0.5 mg/dL (ref 0.0–1.2)
CO2: 18 mmol/L — AB (ref 20–29)
CREATININE: 1.31 mg/dL — AB (ref 0.76–1.27)
Calcium: 9.6 mg/dL (ref 8.7–10.2)
Chloride: 105 mmol/L (ref 96–106)
GFR calc non Af Amer: 68 mL/min/{1.73_m2} (ref >59)
GFR, EST AFRICAN AMERICAN: 78 mL/min/{1.73_m2} (ref >59)
GLOBULIN, TOTAL: 2.8 g/dL (ref 1.5–4.5)
GLUCOSE: 85 mg/dL (ref 65–99)
Potassium: 4.5 mmol/L (ref 3.5–5.2)
SODIUM: 140 mmol/L (ref 134–144)
TOTAL PROTEIN: 7.4 g/dL (ref 6.0–8.5)

## 2018-01-07 LAB — CBC WITH DIFFERENTIAL/PLATELET
Basophils Absolute: 0 10*3/uL (ref 0.0–0.2)
Basos: 1 %
EOS (ABSOLUTE): 0.3 10*3/uL (ref 0.0–0.4)
EOS: 5 %
HEMATOCRIT: 51.9 % — AB (ref 37.5–51.0)
HEMOGLOBIN: 17.7 g/dL (ref 13.0–17.7)
IMMATURE GRANS (ABS): 0 10*3/uL (ref 0.0–0.1)
Immature Granulocytes: 0 %
LYMPHS ABS: 2.4 10*3/uL (ref 0.7–3.1)
LYMPHS: 38 %
MCH: 31.8 pg (ref 26.6–33.0)
MCHC: 34.1 g/dL (ref 31.5–35.7)
MCV: 93 fL (ref 79–97)
MONOCYTES: 8 %
Monocytes Absolute: 0.5 10*3/uL (ref 0.1–0.9)
NEUTROS ABS: 3.2 10*3/uL (ref 1.4–7.0)
Neutrophils: 48 %
Platelets: 229 10*3/uL (ref 150–379)
RBC: 5.56 x10E6/uL (ref 4.14–5.80)
RDW: 13.6 % (ref 12.3–15.4)
WBC: 6.5 10*3/uL (ref 3.4–10.8)

## 2018-01-07 LAB — LIPID PANEL
CHOL/HDL RATIO: 4.7 ratio (ref 0.0–5.0)
Cholesterol, Total: 187 mg/dL (ref 100–199)
HDL: 40 mg/dL (ref >39)
LDL CALC: 101 mg/dL — AB (ref 0–99)
Triglycerides: 230 mg/dL — ABNORMAL HIGH (ref 0–149)
VLDL CHOLESTEROL CAL: 46 mg/dL — AB (ref 5–40)

## 2018-01-07 LAB — TSH: TSH: 1.37 u[IU]/mL (ref 0.450–4.500)

## 2018-01-07 NOTE — Telephone Encounter (Signed)
Called to discussed recent lab results.   CBC with mild elevation of hematocrit to 51.9 (chronic).  Will assess for possible OSA.  He is not a smoker.    BMP with mildly elevated serum creatinine to 2.70 and mild metabolic acidosis with bicarb to 18. Patient is not on NSAID or nephrotoxic drug. Recommended follow-up in a month or 2 for repeat BMP.  Future order placed.  TSH normal.    ASCVD risk score 4% based on recent lipid panel. Lifestyle change including diet and exercise is sufficient at this time.  Up to him to continue pravastatin.   Patient voiced understanding and agrees.  Appreciated the call.

## 2018-02-26 ENCOUNTER — Encounter: Payer: Self-pay | Admitting: Internal Medicine

## 2018-02-26 ENCOUNTER — Other Ambulatory Visit: Payer: Self-pay

## 2018-02-26 ENCOUNTER — Ambulatory Visit (INDEPENDENT_AMBULATORY_CARE_PROVIDER_SITE_OTHER): Payer: Medicare Other | Admitting: Internal Medicine

## 2018-02-26 DIAGNOSIS — M5442 Lumbago with sciatica, left side: Secondary | ICD-10-CM

## 2018-02-26 MED ORDER — CYCLOBENZAPRINE HCL 10 MG PO TABS: 10.0000 mg | ORAL_TABLET | Freq: Three times a day (TID) | ORAL | 0 refills | Status: DC | PRN

## 2018-02-26 NOTE — Assessment & Plan Note (Signed)
Acute L lumbar pain, likely MSK etiology, especially as can reproduce pain with palpation and pain worse with movement and better in certain positions. No red flags. Offered patient Toradol shot in office today, which he declined as he says he does not like needles. Will instead use Flexeril q8 PRN as well as scheduled NSAIDs for next few days. Encouraged continued heating pad use and staying as active as possible. Return precautions discussed.

## 2018-02-26 NOTE — Patient Instructions (Addendum)
It was nice meeting you today Mr. Raska!  For your back pain, you can begin taking Flexeril, one tablet up to every 8 hours as needed. This medication will likely make you sleepy, so do not take it before driving. You should also continue to take ibuprofen or naproxen. Take it on a regular schedule (2 tablets every 6 hours) for the next 3-4 days, until your pain starts to improve. After that, you can take it as needed. You can continue to use heat as well. Try to stay as active as possible so your muscle do not tighten up.   If you develop weakness or numbness in your legs, if your pain worsens, or if you have bowel or bladder incontinence, please call our office or go to the emergency room.   If your pain does not improve in a couple weeks, please schedule another appointment.   If you have any questions or concerns, please feel free to call the clinic.   Be well,  Dr. Avon Gully

## 2018-02-26 NOTE — Progress Notes (Signed)
   Subjective:   Patient: Matthew Wade.       Birthdate: September 23, 1976       MRN: [id number]      HPI  Matthew Wade. is a 42 y.o. male presenting for same day appt for L sided low back pain.   L sided low back pain Began yesterday morning when he was walking. Has history of intermittent low back pain. Radiates down back of leg. Worse when walking and generally with movement. Better in certain positions. Recently has been less active than usual as he has had a cold. Otherwise no changes in physical activity. Has been taking OTC meds which have not been helpful. Has alsl used a heating pad and massage chair which haven't helped much. Certain positions improve pain. Denies LE weakness or numbness. Denies bowel or bladder incontinence.   Smoking status reviewed. Patient is current every day smoker.   Review of Systems See HPI.     Objective:  Physical Exam  Constitutional: He is oriented to person, place, and time. He appears well-developed and well-nourished. No distress.  HENT:  Head: Normocephalic and atraumatic.  Pulmonary/Chest: Effort normal. No respiratory distress.  Musculoskeletal:  TTP of L lumbar region. Able to walk, sit, and stand without assistance, though does grimace when changing positions. 5/5 strength LE bilaterally.   Neurological: He is alert and oriented to person, place, and time.  Skin: Skin is warm and dry.  Psychiatric: He has a normal mood and affect. His behavior is normal.      Assessment & Plan:  Back pain Acute L lumbar pain, likely MSK etiology, especially as can reproduce pain with palpation and pain worse with movement and better in certain positions. No red flags. Offered patient Toradol shot in office today, which he declined as he says he does not like needles. Will instead use Flexeril q8 PRN as well as scheduled NSAIDs for next few days. Encouraged continued heating pad use and staying as active as possible. Return precautions discussed.     Adin Hector, MD, MPH PGY-3 [facility] Pager [phone]

## 2018-05-07 DIAGNOSIS — J3081 Allergic rhinitis due to animal (cat) (dog) hair and dander: Secondary | ICD-10-CM | POA: Diagnosis not present

## 2018-05-07 DIAGNOSIS — J301 Allergic rhinitis due to pollen: Secondary | ICD-10-CM | POA: Diagnosis not present

## 2018-05-07 DIAGNOSIS — J4520 Mild intermittent asthma, uncomplicated: Secondary | ICD-10-CM | POA: Diagnosis not present

## 2018-05-07 DIAGNOSIS — J3089 Other allergic rhinitis: Secondary | ICD-10-CM | POA: Diagnosis not present

## 2018-07-22 DIAGNOSIS — R200 Anesthesia of skin: Secondary | ICD-10-CM | POA: Diagnosis not present

## 2018-07-24 ENCOUNTER — Other Ambulatory Visit: Payer: Medicare Other

## 2018-07-24 DIAGNOSIS — R7989 Other specified abnormal findings of blood chemistry: Secondary | ICD-10-CM | POA: Diagnosis not present

## 2018-07-25 LAB — BASIC METABOLIC PANEL
BUN / CREAT RATIO: 21 — AB (ref 9–20)
BUN: 24 mg/dL (ref 6–24)
CHLORIDE: 104 mmol/L (ref 96–106)
CO2: 21 mmol/L (ref 20–29)
Calcium: 9.2 mg/dL (ref 8.7–10.2)
Creatinine, Ser: 1.16 mg/dL (ref 0.76–1.27)
GFR, EST AFRICAN AMERICAN: 91 mL/min/{1.73_m2} (ref >59)
GFR, EST NON AFRICAN AMERICAN: 78 mL/min/{1.73_m2} (ref >59)
Glucose: 93 mg/dL (ref 65–99)
POTASSIUM: 4 mmol/L (ref 3.5–5.2)
Sodium: 140 mmol/L (ref 134–144)

## 2018-08-07 ENCOUNTER — Ambulatory Visit (INDEPENDENT_AMBULATORY_CARE_PROVIDER_SITE_OTHER): Payer: Medicare Other | Admitting: Family Medicine

## 2018-08-07 ENCOUNTER — Other Ambulatory Visit: Payer: Self-pay

## 2018-08-07 VITALS — BP 110/60 | HR 57 | Temp 98.1°F | Ht 68.0 in | Wt 173.0 lb

## 2018-08-07 DIAGNOSIS — J45901 Unspecified asthma with (acute) exacerbation: Secondary | ICD-10-CM

## 2018-08-07 DIAGNOSIS — R062 Wheezing: Secondary | ICD-10-CM

## 2018-08-07 MED ORDER — ALBUTEROL SULFATE (2.5 MG/3ML) 0.083% IN NEBU
2.5000 mg | INHALATION_SOLUTION | Freq: Once | RESPIRATORY_TRACT | Status: AC
  Administered 2018-08-07: 2.5 mg via RESPIRATORY_TRACT

## 2018-08-07 MED ORDER — ALBUTEROL SULFATE (2.5 MG/3ML) 0.083% IN NEBU: 2.5000 mg | INHALATION_SOLUTION | Freq: Four times a day (QID) | RESPIRATORY_TRACT | 1 refills | Status: DC | PRN

## 2018-08-07 MED ORDER — IPRATROPIUM BROMIDE 0.02 % IN SOLN
0.5000 mg | Freq: Once | RESPIRATORY_TRACT | Status: AC
  Administered 2018-08-07: 0.5 mg via RESPIRATORY_TRACT

## 2018-08-07 MED ORDER — DOXYCYCLINE HYCLATE 100 MG PO TABS: 100.0000 mg | ORAL_TABLET | Freq: Two times a day (BID) | ORAL | 0 refills | Status: DC

## 2018-08-07 MED ORDER — PREDNISONE 10 MG PO TABS: ORAL_TABLET | ORAL | 0 refills | Status: DC

## 2018-08-07 NOTE — Patient Instructions (Signed)
Use the Prednisone as follows:  Take 6 pills today, 5 pills tomorrow, 4 pills today after, 3 pills after, 2 pills after, 1 pill last day  Take the doxycycline once in the AM and once in the PM.  I've sent in the nebulizer for you.  Let us know if you're no better in the next several days.  Cutting back on smoking has been great

## 2018-08-07 NOTE — Progress Notes (Signed)
Subjective:    Matthew Wade. is a 42 y.o. male who presents to [facility] today for cough and wheezing:  1.  Cough and wheezing: Present for the past 2 days.  He did have a fever initially.  He has not had one for the past 48 hours at least.  Cough is worse at night.  Mostly nonproductive though occasionally coughing up thin yellow sputum.  He has been using his albuterol inhaler without much relief.  No chest pain.  Came today to see about his cough.  ROS as above per HPI.    The following portions of the patient's history were reviewed and updated as appropriate: allergies, current medications, past medical history, family and social history, and problem list. Patient is a nonsmoker.    PMH reviewed.  Past Medical History:  Diagnosis Date  . Asthma 06/15/2014  . Hyperlipemia   . Seizures ([place])    per pt "granny seizure as child"   Past Surgical History:  Procedure Laterality Date  . COLONOSCOPY WITH PROPOFOL N/A 02/08/2016   Procedure: COLONOSCOPY WITH PROPOFOL;  Surgeon: Manya Silvas, MD;  Location: [facility] ENDOSCOPY;  Service: Endoscopy;  Laterality: N/A;  . ESOPHAGOGASTRODUODENOSCOPY (EGD) WITH PROPOFOL N/A 02/08/2016   Procedure: ESOPHAGOGASTRODUODENOSCOPY (EGD) WITH PROPOFOL;  Surgeon: Manya Silvas, MD;  Location: [facility] ENDOSCOPY;  Service: Endoscopy;  Laterality: N/A;  . HAND SURGERY     Right "pinky" finger amputation    Medications reviewed. Current Outpatient Medications  Medication Sig Dispense Refill  . albuterol (PROAIR HFA) 108 (90 Base) MCG/ACT inhaler Inhale 2 puffs into the lungs every 6 (six) hours as needed for wheezing. Reported on 01/12/2016    . cyclobenzaprine (FLEXERIL) 10 MG tablet Take 1 tablet (10 mg total) by mouth 3 (three) times daily as needed for muscle spasms. 30 tablet 0  . levocetirizine (XYZAL) 5 MG tablet Take by mouth.    . pravastatin (PRAVACHOL) 20 MG tablet Take 20 mg by mouth daily.    . promethazine (PHENERGAN) 12.5 MG tablet Take 1  tablet (12.5 mg total) by mouth every 6 (six) hours as needed for nausea or vomiting. 30 tablet 0  . ranitidine (ZANTAC) 150 MG tablet Take by mouth.     No current facility-administered medications for this visit.      Objective:   Physical Exam BP 110/60   Pulse (!) 57   Temp 98.1 F (36.7 C) (Oral)   Ht 5\' 8"  (1.727 m)   Wt 173 lb (78.5 kg)   SpO2 94%   BMI 26.30 kg/m  Gen:  Alert, cooperative patient who appears stated age in no acute distress.  Vital signs reviewed. Head: [place]/AT.   Eyes:  EOMI, PERRL.   Ears:  External ears WNL, Bilateral TM's normal without retraction, redness or bulging. Nose:  Septum midline  Mouth:  MMM, tonsils non-erythematous, non-edematous.   Neck: No lymphadenopathy though he does have some mild tenderness bilateral anterior chain. Cardiac:  Regular rate and rhythm without murmur auscultated.  Good S1/S2. Pulm: Wheezing in bilateral lung fields.  Impression/plan: 1.  Asthma exacerbation: -Status post DuoNeb treatment here in clinic.  Lung exam much improved.  Only with minimal wheezing at bases.  Also subjectively much improved.   -Treating as exacerbation.  See AVS.  Prednisone plus doxycycline.  Patient requests nebulizer at home as his daughter has one but did not have the machine.  I have sent this in as well. - FU as needed.

## 2018-08-08 ENCOUNTER — Ambulatory Visit: Payer: Medicare Other | Admitting: Family Medicine

## 2018-08-26 DIAGNOSIS — R200 Anesthesia of skin: Secondary | ICD-10-CM | POA: Diagnosis not present

## 2018-09-24 DIAGNOSIS — R200 Anesthesia of skin: Secondary | ICD-10-CM | POA: Diagnosis not present

## 2018-10-01 DIAGNOSIS — R200 Anesthesia of skin: Secondary | ICD-10-CM | POA: Diagnosis not present

## 2019-01-09 ENCOUNTER — Other Ambulatory Visit: Payer: Self-pay

## 2019-01-09 ENCOUNTER — Ambulatory Visit (INDEPENDENT_AMBULATORY_CARE_PROVIDER_SITE_OTHER): Payer: Medicare Other | Admitting: Family Medicine

## 2019-01-09 VITALS — BP 98/65 | HR 77 | Temp 97.4°F | Wt 177.0 lb

## 2019-01-09 DIAGNOSIS — M5442 Lumbago with sciatica, left side: Secondary | ICD-10-CM

## 2019-01-09 MED ORDER — CYCLOBENZAPRINE HCL 10 MG PO TABS: 10.0000 mg | ORAL_TABLET | Freq: Three times a day (TID) | ORAL | 0 refills | Status: AC | PRN

## 2019-01-09 MED ORDER — TRAMADOL HCL 50 MG PO TABS
50.0000 mg | ORAL_TABLET | Freq: Two times a day (BID) | ORAL | 0 refills | Status: AC
Start: 2019-01-09 — End: 2019-01-14

## 2019-01-09 NOTE — Progress Notes (Signed)
   Subjective:    Patient ID: Matthew Ledger., male    DOB: 1976/03/01, 43 y.o.   MRN: [id number]   CC: Low back pain  HPI: Patient is a 43 year old male who presents today complaining of low back pain for the past few days.  Patient reports that he fell off his back porch 2 weeks ago.  He has been experiencing some low back pain radiating to his left leg for the past 5 days.  Patient has tried using a heating pad with mild improvement in his symptoms.  Patient has tried meloxicam and Tylenol but denies any improvement in his symptoms.  Patient denies any history of back pain.  Patient denies any urinary or bowel incontinence.  No pain shooting down his right leg.  Denies any fevers or chills.  No saddle anesthesia.  Smoking status reviewed   ROS: all other systems were reviewed and are negative other than in the HPI   Past Medical History:  Diagnosis Date  . Asthma 06/15/2014  . Hyperlipemia   . Seizures ([place])    per pt "granny seizure as child"    Past Surgical History:  Procedure Laterality Date  . COLONOSCOPY WITH PROPOFOL N/A 02/08/2016   Procedure: COLONOSCOPY WITH PROPOFOL;  Surgeon: Manya Silvas, MD;  Location: [facility] ENDOSCOPY;  Service: Endoscopy;  Laterality: N/A;  . ESOPHAGOGASTRODUODENOSCOPY (EGD) WITH PROPOFOL N/A 02/08/2016   Procedure: ESOPHAGOGASTRODUODENOSCOPY (EGD) WITH PROPOFOL;  Surgeon: Manya Silvas, MD;  Location: [facility] ENDOSCOPY;  Service: Endoscopy;  Laterality: N/A;  . HAND SURGERY     Right "pinky" finger amputation    Past medical history, surgical, family, and social history reviewed and updated in the EMR as appropriate.  Objective:  BP 98/65   Pulse 77   Temp (!) 97.4 F (36.3 C) (Oral)   Wt 177 lb (80.3 kg)   SpO2 97%   BMI 26.91 kg/m   Vitals and nursing note reviewed  General: NAD, pleasant, able to participate in exam Cardiac: RRR, normal heart sounds, no murmurs. 2+ radial and PT pulses bilaterally Respiratory: CTAB, normal  effort, No wheezes, rales or rhonchi Abdomen: soft, nontender, nondistended, no hepatic or splenomegaly, +BS Low back exam: No lumbar lordosis, thoracic kyphosis, scoliosis, pelvic assymmetry/tilt. No TTP of spinous process, sacroiliac joints, paraspinous muscles, decreased lumbar flexion/extension, strength hip (adduction, flexion), normal straight leg raise, FABER, patellar and achilles reflex intact.  Extremities: no edema or cyanosis. WWP. Skin: warm and dry, no rashes noted Neuro: alert and oriented x4, no focal deficits Psych: Normal affect and mood   Assessment & Plan:   Back pain Patient presented with low back pain after recent fall at home. Patient has left leg radiculopathy which has responded minimally to tylenol and NSAIDs (meloxicam) but mildly responsive to heating pad. Suspect muscle strain with spasms causing radiculopathy. Low suspicion for vertebral fractures or spinal injury. Will prescribe flexeril 10 mg tid, tramadol for 5 days and continue with heating pad. Patient was not interested in any NSAIDs. Encourage activity and discourage sedentary lifestyle for the next few days. Patient will follow up in the next few days in symptoms do not improve.    Marjie Skiff, MD [place] PGY-3

## 2019-01-09 NOTE — Patient Instructions (Signed)
It was great seeing you today! We have addressed the following issues today  1. I have prescribe flexeril a muscle relaxer your will take for 5 days. 2. I also prescribe tramadol 50 mg you can take it twice a day for the next few days.  3. Continue using the heating pad and make sure you stay active. Do not just sit or lay in bed all day, it will make your symptoms worse.  If we did any lab work today, and the results require attention, either me or my nurse will get in touch with you. If everything is normal, you will get a letter in mail and a message via . If you don't hear from [place] in two weeks, please give [place] a call. Otherwise, we look forward to seeing you again at your next visit. If you have any questions or concerns before then, please call the clinic at [phone].  Please bring all your medications to every doctors visit  Sign up for My Chart to have easy access to your labs results, and communication with your Primary care physician. Please ask Front Desk for some assistance.   Please check-out at the front desk before leaving the clinic.    Take Care,   Dr. Andy Gauss

## 2019-01-09 NOTE — Assessment & Plan Note (Signed)
Patient presented with low back pain after recent fall at home. Patient has left leg radiculopathy which has responded minimally to tylenol and NSAIDs (meloxicam) but mildly responsive to heating pad. Suspect muscle strain with spasms causing radiculopathy. Low suspicion for vertebral fractures or spinal injury. Will prescribe flexeril 10 mg tid, tramadol for 5 days and continue with heating pad. Patient was not interested in any NSAIDs. Encourage activity and discourage sedentary lifestyle for the next few days. Patient will follow up in the next few days in symptoms do not improve.

## 2019-03-09 ENCOUNTER — Encounter: Payer: Self-pay | Admitting: Student in an Organized Health Care Education/Training Program

## 2019-03-09 ENCOUNTER — Ambulatory Visit (INDEPENDENT_AMBULATORY_CARE_PROVIDER_SITE_OTHER): Payer: Medicare Other | Admitting: Student in an Organized Health Care Education/Training Program

## 2019-03-09 ENCOUNTER — Other Ambulatory Visit: Payer: Self-pay

## 2019-03-09 VITALS — BP 126/80 | HR 77

## 2019-03-09 DIAGNOSIS — M5442 Lumbago with sciatica, left side: Secondary | ICD-10-CM | POA: Diagnosis not present

## 2019-03-09 DIAGNOSIS — Z800 Family history of malignant neoplasm of digestive organs: Secondary | ICD-10-CM | POA: Diagnosis not present

## 2019-03-09 DIAGNOSIS — M545 Low back pain, unspecified: Secondary | ICD-10-CM | POA: Insufficient documentation

## 2019-03-09 DIAGNOSIS — E7800 Pure hypercholesterolemia, unspecified: Secondary | ICD-10-CM

## 2019-03-09 NOTE — Assessment & Plan Note (Signed)
Acute on chronic flare. No sciatica. No red flag symptoms. Patient self-medicates with marijuana per his admission.  - tylenol PRN - ibuprofen PRN - heating pad - hold off opioids given hx substance abuse - follow up precautions provided

## 2019-03-09 NOTE — Progress Notes (Signed)
Subjective:    Matthew Wade. - 43 y.o. male MRN [id number]  Date of birth: 16-Dec-1975  HPI  Matthew Wade. is here for "a blood count".  Cholesterol Check The patient reports that he has a history of elevated cholesterol for which he has been prescribed a statin medication in the past. He reports that he has been focusing on diet and exercise. He reports that he has not been taking the statin recently. He denies chest pain, dyspnea, or claudication symptoms.  Family History of Colon Cancer Patient reports that his father has a history of colon cancer which may have been diagnosed in his 47s. The patient indicates that he has followed with a GI doctor and has had colonoscopies in the past. He reports that he would like to transfer care to [facility] because he recently moved. No recent diarrhea, constipation, melena or weight loss.  Back Pain Back pain began 2-3 days ago. Pain is described as achy in the lower back on the right. Patient has tried muscle relaxers. Pain radiates to the upper right thigh, not down the leg. History of trauma or injury: denies Patient believes might be causing their pain: flare of chronic back pain  Prior history of similar pain: yes, most recently at last office visit in march History of cancer: denies Weak immune system:  denies History of IV drug use: denies History of steroid use: denies  Symptoms Incontinence of bowel or bladder:  denies Numbness of leg: denies Fever: denies Rest or Night pain: denies Weight Loss:  denies Rash: denies  Health Maintenance:  Health Maintenance Due  Topic Date Due  . TETANUS/TDAP  11/12/1996    -  reports that he has been smoking cigarettes. He has a 3.50 pack-year smoking history. He has never used smokeless tobacco. - Review of Systems: Per HPI. - Past Medical History: Patient Active Problem List   Diagnosis Date Noted  . Acute left-sided low back pain without sciatica 03/09/2019  . Annual  physical exam 01/07/2018  . Hypercholesterolemia 01/07/2018  . Bilateral carpal tunnel syndrome 01/07/2018  . Family history of colon cancer in father 01/07/2018  . Legally blind 01/07/2018  . Pure hypercholesterolemia 01/06/2018  . Ureteral calculi 01/13/2016  . Allergic rhinitis 06/15/2014  . Asthma 06/15/2014  . Esophageal reflux 06/15/2014  . Knee pain 02/16/2013  . Kidney stones 09/10/2012  . Back pain 08/12/2012  . Anesthesia of skin 08/12/2012   - Medications: reviewed and updated Current Outpatient Medications  Medication Sig Dispense Refill  . albuterol (PROAIR HFA) 108 (90 Base) MCG/ACT inhaler Inhale 2 puffs into the lungs every 6 (six) hours as needed for wheezing. Reported on 01/12/2016    . albuterol (PROVENTIL) (2.5 MG/3ML) 0.083% nebulizer solution Take 3 mLs (2.5 mg total) by nebulization every 6 (six) hours as needed for wheezing or shortness of breath. 150 mL 1  . cyclobenzaprine (FLEXERIL) 10 MG tablet Take 1 tablet (10 mg total) by mouth 3 (three) times daily as needed for muscle spasms. 30 tablet 0  . doxycycline (VIBRA-TABS) 100 MG tablet Take 1 tablet (100 mg total) by mouth 2 (two) times daily. 20 tablet 0  . levocetirizine (XYZAL) 5 MG tablet Take by mouth.    . pravastatin (PRAVACHOL) 20 MG tablet Take 20 mg by mouth daily.    . predniSONE (DELTASONE) 10 MG tablet Take 6 pills today, 5 pills tomorrow, 4 pills today after, 3 pills after, 2 pills after, 1 pill last day 21  tablet 0  . promethazine (PHENERGAN) 12.5 MG tablet Take 1 tablet (12.5 mg total) by mouth every 6 (six) hours as needed for nausea or vomiting. 30 tablet 0  . ranitidine (ZANTAC) 150 MG tablet Take by mouth.     No current facility-administered medications for this visit.     Review of Systems See HPI     Objective:   Physical Exam BP 126/80   Pulse 77   SpO2 97%  Gen: NAD, alert, cooperative with exam, well-appearing  HEENT: NCAT, PERRL, clear conjunctiva, oropharynx clear, supple  neck CV: RRR, good S1/S2, no murmur, no edema, capillary refill brisk  Resp: CTABL, no wheezes, non-labored Abd: SNTND, BS present, no guarding or organomegaly Skin: no rashes, normal turgor  Neuro: no gross deficits.  Psych: good insight, alert and oriented     Assessment & Plan:   Back pain Acute on chronic flare. No sciatica. No red flag symptoms. Patient self-medicates with marijuana per his admission.  - tylenol PRN - ibuprofen PRN - heating pad - hold off opioids given hx substance abuse - follow up precautions provided  Family history of colon cancer in father Patient would like to establish with GI in [place]. Ambulatory referral placed and colonoscopy form provided.  Pure hypercholesterolemia No CP, dyspnea or claudication symptoms. Check lipid panel today. Will call patient with results and advise on statin at that time based on ASCVD risk score.    Orders Placed This Encounter  Procedures  . Lipid Panel  . Ambulatory referral to Gastroenterology    Referral Priority:   Routine    Referral Type:   Consultation    Referral Reason:   Specialty Services Required    Number of Visits Requested:   1   Everrett Coombe, MD,MS,  PGY3 03/09/2019 11:55 AM

## 2019-03-09 NOTE — Assessment & Plan Note (Addendum)
No CP, dyspnea or claudication symptoms. Check lipid panel today. Will call patient with results and advise on statin at that time based on ASCVD risk score.

## 2019-03-09 NOTE — Assessment & Plan Note (Signed)
Patient would like to establish with GI in [place]. Ambulatory referral placed and colonoscopy form provided.

## 2019-03-09 NOTE — Patient Instructions (Signed)
It was a pleasure seeing you today in our clinic. Today we discussed your back pain and cholesterol. Here is the treatment plan we have discussed and agreed upon together:  Please use tylenol and ibuprofen for back pain. Continue to use the heating pad.  We drew blood work at today's visit. I will call or send you a letter with these results. If you do not hear from me within the next week, please give our office a call.  Our clinic's number is [phone]. Please call with questions or concerns about what we discussed today.  Be well, Dr. Burr Medico

## 2019-03-10 LAB — LIPID PANEL
Chol/HDL Ratio: 4.9 ratio (ref 0.0–5.0)
Cholesterol, Total: 220 mg/dL — ABNORMAL HIGH (ref 100–199)
HDL: 45 mg/dL (ref >39)
LDL Calculated: 155 mg/dL — ABNORMAL HIGH (ref 0–99)
Triglycerides: 98 mg/dL (ref 0–149)
VLDL Cholesterol Cal: 20 mg/dL (ref 5–40)

## 2019-03-11 DIAGNOSIS — J3089 Other allergic rhinitis: Secondary | ICD-10-CM | POA: Diagnosis not present

## 2019-03-11 DIAGNOSIS — J301 Allergic rhinitis due to pollen: Secondary | ICD-10-CM | POA: Diagnosis not present

## 2019-03-11 DIAGNOSIS — J3081 Allergic rhinitis due to animal (cat) (dog) hair and dander: Secondary | ICD-10-CM | POA: Diagnosis not present

## 2019-03-11 DIAGNOSIS — J4520 Mild intermittent asthma, uncomplicated: Secondary | ICD-10-CM | POA: Diagnosis not present

## 2019-06-19 ENCOUNTER — Other Ambulatory Visit: Payer: Self-pay

## 2019-06-19 ENCOUNTER — Ambulatory Visit (INDEPENDENT_AMBULATORY_CARE_PROVIDER_SITE_OTHER): Payer: Medicare Other | Admitting: Family Medicine

## 2019-06-19 VITALS — BP 123/80 | HR 107 | Wt 180.4 lb

## 2019-06-19 DIAGNOSIS — M79641 Pain in right hand: Secondary | ICD-10-CM

## 2019-06-19 DIAGNOSIS — M79642 Pain in left hand: Secondary | ICD-10-CM | POA: Diagnosis not present

## 2019-06-19 NOTE — Progress Notes (Signed)
     Subjective: Chief Complaint  Patient presents with  . Hand Pain    bilateral  . hand numbness    bilateral  . hand burning    bilateral     HPI: Matthew Wade. is a 43 y.o. presenting to clinic today to discuss the following:  Chronic Bilateral Hand Pain  Matthew Wade is a 43y/o male who presents today for worsening chronic bilateral hand pain. He describes the pain as burning, numbness, and ache that his constant and radiates from the wrist and hand to the 1st, 2nd, and 3rd fingers. He states it has been ongoing for over a year. He has been to [facility], had multiple imaging studies performed including EMG (which was normal), steroid injections, and taken NSAIDS, and worn bracing with no help. He does state wearing the bracing during the day seems to help but nothing else brings relief. He denies any rashes, joint swelling, skin changes. No fever or chills.  ROS noted in HPI.   Past Medical, Surgical, Social, and Family History Reviewed & Updated per EMR.   Pertinent Historical Findings include:   Social History   Tobacco Use  Smoking Status Current Every Day Smoker  . Packs/day: 0.25  . Years: 14.00  . Pack years: 3.50  . Types: Cigarettes  Smokeless Tobacco Never Used   Objective: BP 123/80   Pulse (!) 107   Wt 180 lb 6.4 oz (81.8 kg)   SpO2 98%   BMI 27.43 kg/m  Vitals and nursing notes reviewed  Physical Exam Gen: Alert and Oriented x 3, NAD HEENT: Normocephalic, atraumatic MSK: Wrist and Hand, Bilateral: No tenderness noted. Inspection yielded no erythema, ecchymosis, bony deformity, or swelling. ROM full with good flexion and extension and ulnar/radial deviation that is symmetrical with opposite wrist. Palpation is normal over metacarpals, scaphoid, lunate, and TFCC; tendons without tenderness/swelling. Strength 5/5 in all directions without pain. Positive Finkelstein, tinel's and phalens. Ext: no clubbing, cyanosis, or edema Neuro: No gross  deficits Skin: warm, dry, intact, no rash  No results found for this or any previous visit (from the past 72 hour(s)).  Assessment/Plan:  Bilateral hand pain Unclear etiology, symptoms and exam consistent with carpal tunnel. Given he has had extensive workup and treatment by [facility] and they did not find any improvement in treating his carpal tunnel I do wonder if this is indeed his problem. I will check for RA to rule it out but on exam I did not see any signs or RA however, could be early RA. - ESR, CRP, and RF today - Referral to occupational therapy [place] for possible custom bracing   PATIENT EDUCATION PROVIDED: See AVS    Diagnosis and plan along with any newly prescribed medication(s) were discussed in detail with this patient today. The patient verbalized understanding and agreed with the plan. Patient advised if symptoms worsen return to clinic or ER.   Orders Placed This Encounter  Procedures  . Sedimentation Rate  . C-reactive protein  . Rheumatoid factor  . Ambulatory referral to Occupational Therapy    Referral Priority:   Routine    Referral Type:   Occupational Therapy    Referral Reason:   Specialty Services Required    Requested Specialty:   Occupational Therapy    Number of Visits Requested:   [place], DO 06/19/2019, 9:52 AM PGY-3 [place]

## 2019-06-19 NOTE — Assessment & Plan Note (Signed)
Unclear etiology, symptoms and exam consistent with carpal tunnel. Given he has had extensive workup and treatment by [facility] and they did not find any improvement in treating his carpal tunnel I do wonder if this is indeed his problem. I will check for RA to rule it out but on exam I did not see any signs or RA however, could be early RA. - ESR, CRP, and RF today - Referral to occupational therapy [facility] for possible custom bracing

## 2019-06-19 NOTE — Patient Instructions (Addendum)
It was great to meet you today! Thank you for letting me participate in your care!  Today, we discussed your continued and chronic hand pain. I am getting some blood work to rule out other causes than carpal tunnel.  Be well, Matthew Rutherford, DO PGY-3, [facility]

## 2019-06-20 LAB — SEDIMENTATION RATE: Sed Rate: 14 mm/hr (ref 0–15)

## 2019-06-20 LAB — RHEUMATOID FACTOR: Rhuematoid fact SerPl-aCnc: <10 IU/mL (ref 0.0–13.9)

## 2019-06-20 LAB — C-REACTIVE PROTEIN: CRP: 2 mg/L (ref 0–10)

## 2020-05-27 ENCOUNTER — Encounter: Payer: Self-pay | Admitting: Family Medicine

## 2020-05-27 ENCOUNTER — Ambulatory Visit (INDEPENDENT_AMBULATORY_CARE_PROVIDER_SITE_OTHER): Payer: Medicare Other | Admitting: Family Medicine

## 2020-05-27 ENCOUNTER — Other Ambulatory Visit: Payer: Self-pay

## 2020-05-27 VITALS — BP 110/70 | HR 96 | Ht 68.0 in | Wt 187.0 lb

## 2020-05-27 DIAGNOSIS — Z1159 Encounter for screening for other viral diseases: Secondary | ICD-10-CM

## 2020-05-27 DIAGNOSIS — E7800 Pure hypercholesterolemia, unspecified: Secondary | ICD-10-CM

## 2020-05-27 DIAGNOSIS — L02429 Furuncle of limb, unspecified: Secondary | ICD-10-CM

## 2020-05-27 DIAGNOSIS — E782 Mixed hyperlipidemia: Secondary | ICD-10-CM

## 2020-05-27 MED ORDER — CEPHALEXIN 500 MG PO CAPS: 500.0000 mg | ORAL_CAPSULE | Freq: Three times a day (TID) | ORAL | 0 refills | Status: AC

## 2020-05-27 NOTE — Patient Instructions (Addendum)
It was great seeing you today!  Please check-out at the front desk before leaving the clinic. I'd like to see you back in  for any new issues we're happy to fit you in, just give [place] a call!  Visit Remembers: - Stop by the pharmacy to pick up your prescription - Continue to work on your healthy eating habits and incorporating exercise into your daily life. -Start taking Keflex for boil. Continue warm compressed.  If not resolved after antibiotic treatment, come back to the clinic for surgical drainage of wound     Regarding lab work today:  Due to recent changes in healthcare laws, you may see the results of your imaging and laboratory studies on MyChart before your provider has had a chance to review them.  I understand that in some cases there may be results that are confusing or concerning to you. Not all laboratory results come back in the same time frame and you may be waiting for multiple results in order to interpret others.  Please give [place] 72 hours in order for your provider to thoroughly review all the results before contacting the office for clarification of your results. If everything is normal, you will get a letter in the mail or a message in My Chart. Please give [place] a call if you do not hear from [place] after 2 weeks.  Please bring all of your medications with you to each visit.    If you haven't already, sign up for My Chart to have easy access to your labs results, and communication with your primary care physician.  Feel free to call with any questions or concerns at any time, at [phone].   Take care,  Dr. Rushie [facility]

## 2020-05-27 NOTE — Progress Notes (Signed)
   SUBJECTIVE:   CHIEF COMPLAINT / HPI:   Chief Complaint  Patient presents with  . Recurrent Skin Infections    possibly, between legs x 1 week     Matthew Wade. is a 44 y.o. male here for bump.   Bump Patient reports a bump on his inner thigh for the past week.  Has been draining pus and serous fluid. Wife has been putting warm compressing and squeezing the area.  States his wife "pooped" it a couple of times.  States that she at times gets blood to come out.  Denies fever, chills or weight loss. Does not endorse hx of MRSA.  No trauma to the area.    Hypercholesterolemia  Patient states he stopped taking his Pravastatin because he was told his numbers are "good".    PERTINENT  PMH / PSH: reviewed and updated as appropriate   OBJECTIVE:   BP 110/70   Pulse 96   Ht 5\' 8"  (1.727 m)   Wt 187 lb (84.8 kg)   SpO2 97%   BMI 28.43 kg/m    GEN: well appearing male in no acute distress  CVS: well perfused  RESP: speaking in full sentences without pause  SKIN: 1 cm sized mobile mass underneath the skin of right leg, no surrounding erythema or streaking, small amount of discharge on bandage   ASSESSMENT/PLAN:   Boil of lower extremity Exam consistent with inner thigh boil. Offered I&D but patient declined.  Will treat with Keflex for 7 days.   - return if fails Abx therapy for I&D  - patient agrees with plan   Mixed hyperlipidemia Patient discontinued pravastatin.  Will obtain lipid panel.  Suspect he may need to restart statin therapy.      Lyndee Hensen, DO PGY-2, [facility] 05/27/2020

## 2020-05-30 ENCOUNTER — Encounter: Payer: Self-pay | Admitting: Family Medicine

## 2020-05-30 DIAGNOSIS — L02429 Furuncle of limb, unspecified: Secondary | ICD-10-CM | POA: Insufficient documentation

## 2020-05-30 LAB — HCV AB W REFLEX TO QUANT PCR: HCV Ab: <0.1 s/co ratio (ref 0.0–0.9)

## 2020-05-30 LAB — LIPID PANEL
Chol/HDL Ratio: 6.1 ratio — ABNORMAL HIGH (ref 0.0–5.0)
Cholesterol, Total: 251 mg/dL — ABNORMAL HIGH (ref 100–199)
HDL: 41 mg/dL (ref >39)
LDL Chol Calc (NIH): 175 mg/dL — ABNORMAL HIGH (ref 0–99)
Triglycerides: 189 mg/dL — ABNORMAL HIGH (ref 0–149)
VLDL Cholesterol Cal: 35 mg/dL (ref 5–40)

## 2020-05-30 LAB — HCV INTERPRETATION

## 2020-05-30 MED ORDER — PRAVASTATIN SODIUM 20 MG PO TABS: 20.0000 mg | ORAL_TABLET | Freq: Every day | ORAL | 3 refills | Status: DC

## 2020-05-30 NOTE — Assessment & Plan Note (Signed)
Exam consistent with inner thigh boil. Offered I&D but patient declined.  Will treat with Keflex for 7 days.   - return if fails Abx therapy for I&D  - patient agrees with plan

## 2020-05-30 NOTE — Assessment & Plan Note (Signed)
Patient discontinued pravastatin.  Will obtain lipid panel.  Suspect he may need to restart statin therapy.

## 2020-09-26 ENCOUNTER — Other Ambulatory Visit: Payer: Self-pay | Admitting: Family Medicine

## 2020-09-26 DIAGNOSIS — E782 Mixed hyperlipidemia: Secondary | ICD-10-CM

## 2020-09-26 DIAGNOSIS — E7800 Pure hypercholesterolemia, unspecified: Secondary | ICD-10-CM

## 2020-09-27 ENCOUNTER — Encounter: Payer: Self-pay | Admitting: Family Medicine

## 2020-09-27 MED ORDER — ROSUVASTATIN CALCIUM 20 MG PO TABS: 20.0000 mg | ORAL_TABLET | Freq: Every day | ORAL | 3 refills | Status: DC

## 2020-09-27 NOTE — Telephone Encounter (Signed)
Called patient in response to refill for pravastatin.  His most recent lipid profile on 05/27/2020 show elevated cholesterol to 251, LDL 175, triglycerides 189.  I think he would benefit from a high intensity statin such as Crestor 20 mg.  Patient reports he does not have any history of adverse side effects to statins in the past.  He is agreeable to start Crestor.  We will recheck his lipids at his next follow-up appointment in January or February 2022.

## 2021-05-18 ENCOUNTER — Other Ambulatory Visit: Payer: Self-pay

## 2021-05-18 ENCOUNTER — Ambulatory Visit (INDEPENDENT_AMBULATORY_CARE_PROVIDER_SITE_OTHER): Payer: Medicare Other | Admitting: Family Medicine

## 2021-05-18 VITALS — BP 112/75 | HR 84 | Ht 68.0 in | Wt 187.6 lb

## 2021-05-18 DIAGNOSIS — M79672 Pain in left foot: Secondary | ICD-10-CM

## 2021-05-18 DIAGNOSIS — E782 Mixed hyperlipidemia: Secondary | ICD-10-CM | POA: Diagnosis not present

## 2021-05-18 NOTE — Patient Instructions (Signed)
It was great to see you today!  Here's what we talked about:  Your left heel pain is possibly due to a condition called plantar fasciitis. I have included some information here about the condition. A heel spur could also be causing your symptoms and leading to the plantar fasciitis. We will order an x-ray for your heel, and you can go whenever you'd like.  We are checking your cholesterol today, and we will call you or send you a message if it is abnormal.  Take care and seek immediate care sooner if you develop any concerns.  Berneta Sages "Branden" Demmi Sindt, [place]

## 2021-05-18 NOTE — Progress Notes (Addendum)
    SUBJECTIVE:   CHIEF COMPLAINT / HPI:   Matthew Wade (MRN: [id number]) is a 45 y.o. male with a history of asthma, HLD, carpal tunnel syndrome, and seizures who presents for evaluation of heel pain and to get blood work.  Left heel pain Patient endorses burning, "pins and needles" pain in his left heel for 4 months. He states he experienced no trauma or insect bites prior to the pain. He has never had this pain before. The pain occurs when he applies pressure on his heel, mainly when walking. He has tried cortisone cream, which has not helped, and he is taking gabapentin 400 mg QHS prescribed by his orthopedist for carpal tunnel syndrome, and this has helped the pain. He usually wears either tennis shoes or sandals but walks barefoot often. He reports walking a mile a day with his wife and kids but that he "deals with" the pain to complete these activities. He also reports that his father has a history of bone spurs and that this could be the cause. Patient is wondering if he can have some medications to help his pain, though tramadol does not work for him, and he "needs something stronger."  Blood work Patient requesting blood work. States he wants to come off his Crestor and feels his recent dietary changes of cutting out eggs will allow him to do so.  PERTINENT  PMH / PSH: Carpal tunnel syndrome for which he takes gabapentin 400 mg QHS  OBJECTIVE:   BP 112/75   Pulse 84   Ht 5\' 8"  (1.727 m)   Wt 187 lb 9.6 oz (85.1 kg)   SpO2 98%   BMI 28.52 kg/m    PHYSICAL EXAM  GEN: Well developed, in NAD, sitting upright in the chair with well-worn sandals on MSK: Left heel appears slightly swollen, marked tenderness to light palpation on the plantar aspect of left heel No pain to palpation elsewhere on the foot or heel. No erythema, warmth, or evidence of entry wound or trauma  ASSESSMENT/PLAN:    Probable plantar fasciitis Patient's left heel pain has persisted for months and is  present only when pressure is applied. There is no evidence of trauma. He reports a family history of bone spurs and feels this could be the cause. Given pain localized to the sole of the heel that is only present with pressure and when walking, plantar fasciitis is highest on the differential. A bone spur is also possible, though likely is the result of chronic plantar fasciitis. -Recommended resting the feet as much as possible and wearing supportive footwear -Continue Gabapentin per his orthopedist -Provided education in AVS about plantar fasciitis -Ordered x-ray of left foot to assess presence of any structural abnormality   HLD Lipid panel in 2021 was remarkable for total cholesterol 251, TG 189, HDL 41, VLDL 35, LDL 175, and chol/HDL 6.1. Patient requesting blood work today because he wants to come off his statin. -Ordered lipid panel -Counseled on how he would likely need to remain on Crestor for optimal lipid control and that any improvement in lipids is likely secondary to Crestor use  [place]   I was personally present and re-performed the exam and medical decision making and verified the service and findings are accurately documented in the student's note.  Alcus Dad, MD 05/19/2021 11:15 AM

## 2021-05-19 LAB — LIPID PANEL
Chol/HDL Ratio: 3.5 ratio (ref 0.0–5.0)
Cholesterol, Total: 136 mg/dL (ref 100–199)
HDL: 39 mg/dL — ABNORMAL LOW (ref >39)
LDL Chol Calc (NIH): 67 mg/dL (ref 0–99)
Triglycerides: 180 mg/dL — ABNORMAL HIGH (ref 0–149)
VLDL Cholesterol Cal: 30 mg/dL (ref 5–40)

## 2021-06-08 ENCOUNTER — Telehealth: Payer: Self-pay | Admitting: *Deleted

## 2021-06-08 DIAGNOSIS — G5603 Carpal tunnel syndrome, bilateral upper limbs: Secondary | ICD-10-CM

## 2021-06-08 NOTE — Telephone Encounter (Signed)
Patient is calling requesting a referral be placed with hand surgery.  He currently sees [facility] for carpal tunnel but states that he isn't able to get in to see them until mid September.  He would like to be referred to a hand surgeon in hopes of getting in sooner than that for bilateral carpal tunnel.  Taraoluwa Thakur,CMA

## 2021-06-09 NOTE — Telephone Encounter (Signed)
Referral for hand surgeon placed per patient request.   Ezequiel Essex, MD

## 2021-06-13 ENCOUNTER — Ambulatory Visit
Admission: RE | Admit: 2021-06-13 | Discharge: 2021-06-13 | Disposition: A | Discharge status: Home / Self Care | Payer: Medicare Other | Source: Ambulatory Visit | Attending: Family Medicine | Admitting: Family Medicine

## 2021-06-13 ENCOUNTER — Other Ambulatory Visit: Payer: Self-pay

## 2021-06-13 DIAGNOSIS — M79672 Pain in left foot: Secondary | ICD-10-CM

## 2021-06-14 ENCOUNTER — Ambulatory Visit: Payer: Medicare Other | Admitting: Family Medicine

## 2021-06-14 NOTE — Progress Notes (Deleted)
   Matthew Wade. is a 45 y.o. male who presents to [facility] today for the following:  Left heel pain Seen by PCP on 7/21 for the same X-ray performed yesterday that was unremarkable   PMH reviewed. *** ROS as above. Medications reviewed.  Exam:  There were no vitals taken for this visit. Gen: Well NAD MSK:  DG Foot Complete Left  Result Date: 06/14/2021 CLINICAL DATA:  Left heel pain.  Plantar fasciitis. EXAM: LEFT FOOT - COMPLETE 3+ VIEW COMPARISON:  No prior. FINDINGS: No acute bony or joint abnormality. No evidence of fracture or dislocation. Soft tissues are unremarkable. No radiopaque foreign body. IMPRESSION: No acute abnormality identified.  Soft tissues are unremarkable. Electronically Signed   By: Marcello Moores  Register M.D.   On: 06/14/2021 08:10     Assessment and Plan: 1) No problem-specific Assessment & Plan notes found for this encounter.   [place] Constable, D.O.  PGY-4 [facility]  06/14/2021 1:35 PM

## 2021-07-04 ENCOUNTER — Ambulatory Visit: Payer: Medicare Other | Admitting: Orthopedic Surgery

## 2021-09-21 ENCOUNTER — Other Ambulatory Visit: Payer: Self-pay | Admitting: Family Medicine

## 2021-11-07 ENCOUNTER — Ambulatory Visit (INDEPENDENT_AMBULATORY_CARE_PROVIDER_SITE_OTHER): Payer: Medicare Other | Admitting: Family Medicine

## 2021-11-07 ENCOUNTER — Other Ambulatory Visit: Payer: Self-pay

## 2021-11-07 ENCOUNTER — Encounter: Payer: Self-pay | Admitting: Family Medicine

## 2021-11-07 VITALS — BP 110/70 | HR 71 | Ht 68.0 in | Wt 199.0 lb

## 2021-11-07 DIAGNOSIS — R110 Nausea: Secondary | ICD-10-CM

## 2021-11-07 DIAGNOSIS — B349 Viral infection, unspecified: Secondary | ICD-10-CM | POA: Diagnosis not present

## 2021-11-07 MED ORDER — PROCHLORPERAZINE MALEATE 5 MG PO TABS: 5.0000 mg | ORAL_TABLET | Freq: Four times a day (QID) | ORAL | 0 refills | Status: DC | PRN

## 2021-11-07 NOTE — Assessment & Plan Note (Signed)
-  symptoms likely secondary to GI illness, low concern for C. Diff given history. Reassuringly patient's symptoms are improving and is able to tolerate fluids -supportive care measures -compazine prescribed for short-term course -strict return precautions discussed

## 2021-11-07 NOTE — Patient Instructions (Signed)
It was great seeing you today!  Today we discussed your symptoms, I am sorry that you were not feeling better but I am glad that you are doing better. I have prescribed compazine for only a few days which you may take for nausea. Please make sure to get plenty of rest and stay hydrated with fluids to avoid getting dehydrated.  If you are not able to take fluids then please go to the emergency department because you may need IV fluids to prevent or resolve dehydration. If you are having worsening diarrhea or experiencing shortness of breath then please call our office to be further evaluated.   Please follow up at your next scheduled appointment, if anything arises between now and then, please don't hesitate to contact our office.   Thank you for allowing [place] to be a part of your medical care!  Thank you, Dr. Larae Grooms

## 2021-11-07 NOTE — Progress Notes (Signed)
° ° °  SUBJECTIVE:   CHIEF COMPLAINT / HPI:   Patient presents with symptoms about a week ago. Noted to have diarrhea, initially though it was something that he ate but it continued for the past few days. At first was having 3-4 episodes a day, now having less. States that it is improving. Feels nauseous intermittently throughout the day, he is able to eat but says that he is scared to eat due to his symptoms. Drinking gatorade and water to replenish. No known sick contacts but has two children in school. Denies recent antibiotic treatment. Before this started, he ate "normal holiday foods." He states that he has not done anything out of the ordinary. Denies chest pain, dyspnea and abdominal pain. Denies hematochezia or hematuria. Denies straining.   OBJECTIVE:   BP 110/70    Pulse 71    Ht 5\' 8"  (1.727 m)    Wt 199 lb (90.3 kg)    SpO2 99%    BMI 30.26 kg/m   General: Patient well-appearing, in no acute distress. HEENT: normal buccal mucosa, moist mucous membranes, no evidence of anterior or posterior cervical LAD CV: RRR Resp: CTAB, normal work of breathing noted without signs of respiratory distress Abdomen: soft, nontender Ext: radial pulses strong and equal bilaterally, moist mucous membranes  Psych: mood appropriate, pleasant   ASSESSMENT/PLAN:   Viral illness -symptoms likely secondary to GI illness, low concern for C. Diff given history. Reassuringly patient's symptoms are improving and is able to tolerate fluids -supportive care measures -compazine prescribed for short-term course -strict return precautions discussed  Donney Dice, [place]

## 2021-11-08 ENCOUNTER — Other Ambulatory Visit: Payer: Self-pay | Admitting: Family Medicine

## 2021-11-08 DIAGNOSIS — R110 Nausea: Secondary | ICD-10-CM

## 2021-11-10 ENCOUNTER — Ambulatory Visit: Payer: Medicare Other | Admitting: Family Medicine

## 2021-12-13 ENCOUNTER — Ambulatory Visit: Payer: Medicare Other | Admitting: Family Medicine

## 2021-12-13 NOTE — Progress Notes (Deleted)
° ° °  SUBJECTIVE:   CHIEF COMPLAINT / HPI:   ***  PERTINENT  PMH / PSH: Blind, renal stones, gabapentin, crestor   OBJECTIVE:   There were no vitals taken for this visit.  ***  ASSESSMENT/PLAN:   No problem-specific Assessment & Plan notes found for this encounter.     Lind Covert, MD [place]

## 2021-12-22 ENCOUNTER — Ambulatory Visit (INDEPENDENT_AMBULATORY_CARE_PROVIDER_SITE_OTHER): Payer: Medicare Other | Admitting: Family Medicine

## 2021-12-22 ENCOUNTER — Other Ambulatory Visit: Payer: Self-pay

## 2021-12-22 VITALS — BP 110/60 | HR 88 | Ht 68.0 in | Wt 203.2 lb

## 2021-12-22 DIAGNOSIS — S0993XA Unspecified injury of face, initial encounter: Secondary | ICD-10-CM | POA: Diagnosis not present

## 2021-12-22 DIAGNOSIS — Z131 Encounter for screening for diabetes mellitus: Secondary | ICD-10-CM | POA: Diagnosis not present

## 2021-12-22 NOTE — Progress Notes (Signed)
° ° °  SUBJECTIVE:   CHIEF COMPLAINT / HPI:   Concern for thrush: 46 year old male presenting with concern for thrush.  He states he was pressure washing with bleach this past weekend and had some splash into his mouth.  He states it did not get in his eyes but he noted a bit of a whitish appearance to his tongue and some burning after this. He has noticed reduced taste since then. He was concerned that he might have thrush due to the whitish appearance of his tongue.  He is not on any inhaled corticosteroids.  PERTINENT  PMH / PSH: None  OBJECTIVE:   BP 110/60    Pulse 88    Ht 5\' 8"  (1.727 m)    Wt 203 lb 4 oz (92.2 kg)    SpO2 98%    BMI 30.90 kg/m    General: NAD, pleasant, able to participate in exam HEENT:Faint whitish layer on posterior tongue that is not scrapable and does not appear consistent with thrush.  Cardiac: RRR, no murmurs. Respiratory: CTAB, normal effort, No wheezes, rales or rhonchi Psych: Normal affect and mood     ASSESSMENT/PLAN:   Concern for thrush: 46 year old male presenting for concern for thrush after having bleach splash into his mouth while pressure washing.  He had reduction of taste and some burning after that occurred.  He noticed a whitish appearance to the back of his tongue and was concerned about thrush.  On physical exam this looks like normal tissue versus mildly depigmented tissue on the back of the tongue, it is not scrappable, does not appear consistent with thrush.  Given his exposure to bleach with burning sensation and reduction in taste I anticipate that this is simply due to that and will improve with some time.  Discussed return precautions.  Screening for diabetes: He would like to be screened for diabetes as he states it runs in his family.  We will do a screening A1c today.  I recommended he schedule a follow-up with his primary doctor in the next 2 to 3 weeks as he is due for an annual visit.   Lurline Del, [place]

## 2021-12-22 NOTE — Patient Instructions (Signed)
The area on your tongue is not consistent with thrush and should get better with some time.  If it does not or if you have worsening pain or any other symptoms do not hesitate to return.  Are going to screen for diabetes today and I will let you know the result when it returns.  I like for you to schedule follow-up with your doctor in the next 2 to 3 weeks for an annual visit.

## 2021-12-22 NOTE — Progress Notes (Deleted)
° ° °  SUBJECTIVE:   CHIEF COMPLAINT / HPI:   Primary symptom:*** Duration:*** Severity:*** Associated symptoms:*** Fever? Tmax?: *** Sick contacts:*** Covid test:*** Covid vaccination(s):***   PERTINENT  PMH / PSH: ***  OBJECTIVE:   There were no vitals taken for this visit.  ***  ASSESSMENT/PLAN:   No problem-specific Assessment & Plan notes found for this encounter.     Gladys Damme, MD [place]

## 2021-12-23 LAB — HEMOGLOBIN A1C
Est. average glucose Bld gHb Est-mCnc: 120 mg/dL
Hgb A1c MFr Bld: 5.8 % — ABNORMAL HIGH (ref 4.8–5.6)

## 2022-04-03 ENCOUNTER — Encounter: Payer: Self-pay | Admitting: *Deleted

## 2022-08-27 ENCOUNTER — Encounter: Payer: Self-pay | Admitting: Family Medicine

## 2022-08-27 ENCOUNTER — Ambulatory Visit (INDEPENDENT_AMBULATORY_CARE_PROVIDER_SITE_OTHER): Payer: Medicare Other | Admitting: Family Medicine

## 2022-08-27 ENCOUNTER — Other Ambulatory Visit: Payer: Self-pay

## 2022-08-27 VITALS — BP 129/86 | HR 69 | Wt 203.8 lb

## 2022-08-27 DIAGNOSIS — J069 Acute upper respiratory infection, unspecified: Secondary | ICD-10-CM

## 2022-08-27 NOTE — Progress Notes (Signed)
  Date of Visit: 08/27/2022   SUBJECTIVE:   HPI:  Matthew Wade presents today for a same day appointment to discuss upper respiratory infection.  Symptoms began 3 days ago.  He has had sore throat, weakness, chills, mild facial congestion, and mild cough.  Eating and drinking well.  His wife and 2 children are sick with similar symptoms.  He took a COVID test at home 2 days ago and it was negative.  He has been trying to generic DayQuil and NyQuil for symptom relief.  Also using Tylenol.  Has not checked his temperature as he does not have a thermometer.  Reports history of asthma, has not needed inhalers for this in years. Also history of allergies, takes levocetirizine daily as needed   PMhx of legal blindness, hyperlipidemia, asthma  OBJECTIVE:   BP 129/86   Pulse 69   Wt 203 lb 12.8 oz (92.4 kg)   SpO2 100%   BMI 30.99 kg/m  Gen: no acute distress, pleasant, cooperative HEENT: normocephalic, atraumatic, moist mucous membranes, oropharynx erythematous without exudates. + mildly tender lymphadenopathy bilateral anterior cervical chain, nares with mild congestion, tympanic membranes clear bilaterally Heart: regular rate and rhythm, no murmur Lungs: clear to auscultation bilaterally, normal work of breathing  Neuro: alert, speech normal, grossly nonfocal  ASSESSMENT/PLAN:   Health maintenance:  -Advised to schedule physical with PCP  Viral upper respiratory infection No signs of bacterial superinfection seen on exam today, overall nontoxic appearing.  Suspect viral upper respiratory infection.  Discussed options of testing for COVID, flu, and RSV.  After discussion of options patient elects to forego testing and instead do symptomatic management.  Discussed role of Afrin, Mucinex, Tylenol, honey for control of symptoms.  He will follow-up if not improving in the next few days. Patient agreeable to this plan & appreciative.  FOLLOW UP: Schedule physical with PCP Follow up as needed  if symptoms worsen or do not improve.   [place]. Ardelia Mems, [place]

## 2022-08-27 NOTE — Patient Instructions (Signed)
Try afrin, mucinex, honey Stay hydrated Follow up if not improving in a few days  Be well, Dr. Ardelia Mems    Upper Respiratory Infection, Adult An upper respiratory infection (URI) is a common viral infection of the nose, throat, and upper air passages that lead to the lungs. The most common type of URI is the common cold. URIs usually get better on their own, without medical treatment. What are the causes? A URI is caused by a virus. You may catch a virus by: Breathing in droplets from an infected person's cough or sneeze. Touching something that has been exposed to the virus (is contaminated) and then touching your mouth, nose, or eyes. What increases the risk? You are more likely to get a URI if: You are very young or very old. You have close contact with others, such as at work, school, or a health care facility. You smoke. You have long-term (chronic) heart or lung disease. You have a weakened disease-fighting system (immune system). You have nasal allergies or asthma. You are experiencing a lot of stress. You have poor nutrition. What are the signs or symptoms? A URI usually involves some of the following symptoms: Runny or stuffy (congested) nose. Cough. Sneezing. Sore throat. Headache. Fatigue. Fever. Loss of appetite. Pain in your forehead, behind your eyes, and over your cheekbones (sinus pain). Muscle aches. Redness or irritation of the eyes. Pressure in the ears or face. How is this diagnosed? This condition may be diagnosed based on your medical history and symptoms, and a physical exam. Your health care provider may use a swab to take a mucus sample from your nose (nasal swab). This sample can be tested to determine what virus is causing the illness. How is this treated? URIs usually get better on their own within 7-10 days. Medicines cannot cure URIs, but your health care provider may recommend certain medicines to help relieve symptoms, such  as: Over-the-counter cold medicines. Cough suppressants. Coughing is a type of defense against infection that helps to clear the respiratory system, so take these medicines only as recommended by your health care provider. Fever-reducing medicines. Follow these instructions at home: Activity Rest as needed. If you have a fever, stay home from work or school until your fever is gone or until your health care provider says your URI cannot spread to other people (is no longer contagious). Your health care provider may have you wear a face mask to prevent your infection from spreading. Relieving symptoms Gargle with a mixture of salt and water 3-4 times a day or as needed. To make salt water, completely dissolve -1 tsp (3-6 g) of salt in 1 cup (237 mL) of warm water. Use a cool-mist humidifier to add moisture to the air. This can help you breathe more easily. Eating and drinking  Drink enough fluid to keep your urine pale yellow. Eat soups and other clear broths. General instructions  Take over-the-counter and prescription medicines only as told by your health care provider. These include cold medicines, fever reducers, and cough suppressants. Do not use any products that contain nicotine or tobacco. These products include cigarettes, chewing tobacco, and vaping devices, such as e-cigarettes. If you need help quitting, ask your health care provider. Stay away from secondhand smoke. Stay up to date on all immunizations, including the yearly (annual) flu vaccine. Keep all follow-up visits. This is important. How to prevent the spread of infection to others URIs can be contagious. To prevent the infection from spreading: Wash your hands  with soap and water for at least 20 seconds. If soap and water are not available, use hand sanitizer. Avoid touching your mouth, face, eyes, or nose. Cough or sneeze into a tissue or your sleeve or elbow instead of into your hand or into the air.  Contact a  health care provider if: You are getting worse instead of better. You have a fever or chills. Your mucus is brown or red. You have yellow or brown discharge coming from your nose. You have pain in your face, especially when you bend forward. You have swollen neck glands. You have pain while swallowing. You have white areas in the back of your throat. Get help right away if: You have shortness of breath that gets worse. You have severe or persistent: Headache. Ear pain. Sinus pain. Chest pain. You have chronic lung disease along with any of the following: Making high-pitched whistling sounds when you breathe, most often when you breathe out (wheezing). Prolonged cough (more than 14 days). Coughing up blood. A change in your usual mucus. You have a stiff neck. You have changes in your: Vision. Hearing. Thinking. Mood. These symptoms may be an emergency. Get help right away. Call 911. Do not wait to see if the symptoms will go away. Do not drive yourself to the hospital. Summary An upper respiratory infection (URI) is a common infection of the nose, throat, and upper air passages that lead to the lungs. A URI is caused by a virus. URIs usually get better on their own within 7-10 days. Medicines cannot cure URIs, but your health care provider may recommend certain medicines to help relieve symptoms. This information is not intended to replace advice given to you by your health care provider. Make sure you discuss any questions you have with your health care provider. Document Revised: 05/17/2021 Document Reviewed: 05/17/2021 Elsevier Patient Education  [place].

## 2022-08-29 ENCOUNTER — Ambulatory Visit (INDEPENDENT_AMBULATORY_CARE_PROVIDER_SITE_OTHER): Payer: Medicare Other | Admitting: Student

## 2022-08-29 VITALS — BP 120/70 | HR 76 | Ht 68.0 in | Wt 204.0 lb

## 2022-08-29 DIAGNOSIS — J069 Acute upper respiratory infection, unspecified: Secondary | ICD-10-CM

## 2022-08-29 MED ORDER — ONDANSETRON 4 MG PO TBDP: 4.0000 mg | ORAL_TABLET | Freq: Three times a day (TID) | ORAL | 0 refills | Status: DC | PRN

## 2022-08-29 NOTE — Patient Instructions (Addendum)
It was great to see you! Thank you for allowing me to participate in your care!   Our plans for today:  - I have prescribed zofran to help with nausea.  - I have sent out flu/covid tests - Please return if not improved in 1 week - Please go to ED if you are not able to hold down anything such as water/fluids - It is important to try and drink as much fluids as possible especially while you are having diarrhea  Take care and seek immediate care sooner if you develop any concerns.  Gerrit Heck, MD

## 2022-08-29 NOTE — Progress Notes (Signed)
    SUBJECTIVE:   CHIEF COMPLAINT / HPI: Sick and would like to be tested  Seen 2 days ago for similar symptoms but was not tested. His symptoms first started last Wednesday. Feels cold, body aches, head hurts. Mild dry cough. Lots of congestion. Feels very tired. Has been drinking gatorade. Has been getting nauseous when trying to drink. Wants to test for flu and covid today. Awhile ago took home covid test which was negative. Wife and daughters also sick. Has also started having diarrhea after that visit but no vomiting. Unsure if he has had any fevers. He is taking Dayquil and Nyquil as needed.  PERTINENT  PMH / PSH: HLD, asthma  OBJECTIVE:   BP 120/70   Pulse 76   Ht '5\' 8"'$  (1.727 m)   Wt 204 lb (92.5 kg)   SpO2 98%   BMI 31.02 kg/m   General: Nontoxic but tired appearing, awake, alert, responsive to questions Head: Normocephalic atraumatic, nasal congestion present, tonsillar exudates present on left, TM normal bilaterally, canals normal bilaterally, tender cervical anterior lymphadenopathy, MMM CV: Regular rate and rhythm no murmurs rubs or gallops, cap refill ~1 sec Respiratory: Clear to ausculation bilaterally, no consolidation, no w/r/c no increased work of breathing Abdomen: Soft, non-distended, normoactive bowel sounds  Extremities: Moves upper and lower extremities freely, no edema in LE Skin: No rashes or lesions visualized   ASSESSMENT/PLAN:   Viral upper respiratory infection Appears well hydrated today but discussed if not able to hold down fluids in next 1-2 days may need to go to ED for fluid resuscitation. We will trial zofran as well to help with nausea symptoms in hopes this will improve oral intake. Centor score was 2, low likelihood of GAS did not test today but if returns could consider testing.  - Coronavirus (COVID-19) with Influenza A and Influenza B - ondansetron (ZOFRAN-ODT) 4 MG disintegrating tablet; Take 1 tablet (4 mg total) by mouth every 8 (eight)  hours as needed for nausea or vomiting.  Dispense: 20 tablet; Refill: 0  - symptomatic measures discussed - ED precautions discussed  Gerrit Heck, MD [place]

## 2022-08-31 LAB — COVID-19, FLU A+B NAA
Influenza A, NAA: NOT DETECTED
Influenza B, NAA: NOT DETECTED
SARS-CoV-2, NAA: NOT DETECTED

## 2022-09-16 ENCOUNTER — Other Ambulatory Visit: Payer: Self-pay | Admitting: Family Medicine

## 2022-09-16 DIAGNOSIS — R7989 Other specified abnormal findings of blood chemistry: Secondary | ICD-10-CM

## 2022-09-16 DIAGNOSIS — E782 Mixed hyperlipidemia: Secondary | ICD-10-CM

## 2022-09-16 DIAGNOSIS — R7303 Prediabetes: Secondary | ICD-10-CM

## 2022-10-29 DIAGNOSIS — I2510 Atherosclerotic heart disease of native coronary artery without angina pectoris: Secondary | ICD-10-CM

## 2022-10-29 HISTORY — DX: Atherosclerotic heart disease of native coronary artery without angina pectoris: I25.10

## 2022-11-07 ENCOUNTER — Emergency Department (HOSPITAL_COMMUNITY): Payer: Medicare HMO

## 2022-11-07 ENCOUNTER — Other Ambulatory Visit: Payer: Self-pay

## 2022-11-07 ENCOUNTER — Ambulatory Visit (HOSPITAL_COMMUNITY): Payer: Medicaid Other | Attending: Family Medicine

## 2022-11-07 ENCOUNTER — Ambulatory Visit (INDEPENDENT_AMBULATORY_CARE_PROVIDER_SITE_OTHER): Payer: Medicare HMO | Admitting: Family Medicine

## 2022-11-07 ENCOUNTER — Emergency Department (HOSPITAL_COMMUNITY)
Admission: EM | Admit: 2022-11-07 | Discharge: 2022-11-07 | Discharge status: Against Medical Advice | Payer: Medicare HMO | Attending: Physician Assistant | Admitting: Physician Assistant

## 2022-11-07 ENCOUNTER — Encounter: Payer: Self-pay | Admitting: Family Medicine

## 2022-11-07 VITALS — BP 116/66 | HR 98 | Wt 207.0 lb

## 2022-11-07 DIAGNOSIS — R209 Unspecified disturbances of skin sensation: Secondary | ICD-10-CM | POA: Diagnosis not present

## 2022-11-07 DIAGNOSIS — Z5321 Procedure and treatment not carried out due to patient leaving prior to being seen by health care provider: Secondary | ICD-10-CM | POA: Diagnosis not present

## 2022-11-07 DIAGNOSIS — R079 Chest pain, unspecified: Secondary | ICD-10-CM | POA: Insufficient documentation

## 2022-11-07 DIAGNOSIS — R531 Weakness: Secondary | ICD-10-CM | POA: Insufficient documentation

## 2022-11-07 LAB — CBC
HCT: 52.5 % — ABNORMAL HIGH (ref 39.0–52.0)
Hemoglobin: 17.9 g/dL — ABNORMAL HIGH (ref 13.0–17.0)
MCH: 31.9 pg (ref 26.0–34.0)
MCHC: 34.1 g/dL (ref 30.0–36.0)
MCV: 93.6 fL (ref 80.0–100.0)
Platelets: 238 10*3/uL (ref 150–400)
RBC: 5.61 MIL/uL (ref 4.22–5.81)
RDW: 12.8 % (ref 11.5–15.5)
WBC: 5.3 10*3/uL (ref 4.0–10.5)
nRBC: 0 % (ref 0.0–0.2)

## 2022-11-07 LAB — TROPONIN I (HIGH SENSITIVITY): Troponin I (High Sensitivity): 3 ng/L (ref <18)

## 2022-11-07 LAB — BASIC METABOLIC PANEL
Anion gap: 8 (ref 5–15)
BUN: 17 mg/dL (ref 6–20)
CO2: 24 mmol/L (ref 22–32)
Calcium: 9.3 mg/dL (ref 8.9–10.3)
Chloride: 107 mmol/L (ref 98–111)
Creatinine, Ser: 1.01 mg/dL (ref 0.61–1.24)
GFR, Estimated: >60 mL/min (ref >60)
Glucose, Bld: 108 mg/dL — ABNORMAL HIGH (ref 70–99)
Potassium: 4.5 mmol/L (ref 3.5–5.1)
Sodium: 139 mmol/L (ref 135–145)

## 2022-11-07 MED ORDER — ASPIRIN 325 MG PO TABS
325.0000 mg | ORAL_TABLET | Freq: Once | ORAL | Status: AC
  Administered 2022-11-07: 325 mg via ORAL

## 2022-11-07 NOTE — Patient Instructions (Signed)
Go to the ER for chest pain evaluation

## 2022-11-07 NOTE — ED Triage Notes (Signed)
Pt. Stated, I've had chest pain for 3 weeks and left arm numbness some.

## 2022-11-07 NOTE — ED Provider Triage Note (Signed)
Emergency Medicine Provider Triage Evaluation Note  Matthew Wade. , a 47 y.o. male  was evaluated in triage.  Pt complains of chest pain which has been ongoing for the past 2 weeks. Symptoms are worst with exertion but alleviated with rest. Seen by his PCP today and sent to the ED for chest pain evaluation. He was given aspirin today at the office. He states it feels like "my heart is coming out of my chest". Family hx of  CAD.   Review of Systems  Positive: Chest pain, sob Negative: Fever, cough  Physical Exam  There were no vitals taken for this visit. Gen:   Awake, no distress   Resp:  Normal effort  MSK:   Moves extremities without difficulty  Other:    Medical Decision Making  Medically screening exam initiated at 9:59 AM.  Appropriate orders placed.  Blima Ledger. was informed that the remainder of the evaluation will be completed by another provider, this initial triage assessment does not replace that evaluation, and the importance of remaining in the ED until their evaluation is complete.     Janeece Fitting, PA-C 11/07/22 1002

## 2022-11-07 NOTE — ED Notes (Signed)
Pt approached this tech and said "I cannot wait any longer I am just going to try and go somewhere else. I am sorry."

## 2022-11-07 NOTE — Assessment & Plan Note (Addendum)
Chest pain x2 weeks with concerns for ischemic symptoms given the worsening with exertion and improvement with rest. Has associated shortness of breath and significant fatigue. Pain has been intermittently worsening and feel that it is appropriate for patient to have full evaluation in the ER. EKG is reassuring at this time, but given the description this is technically an unstable angina; ASCVD risk is 3% based on calculator. Patient is agreeable and reports that his wife is outside and will take him to the ER. Called charge nurse to notify her that he will be coming in for evaluation and patient was given '325mg'$  ASA.

## 2022-11-07 NOTE — Progress Notes (Signed)
    SUBJECTIVE:   CHIEF COMPLAINT / HPI:   Chest Pain - Has chest pressure on his left chest, started 2 weeks ago and left arm numbness - Not having diaphoresis  - Worse with exertion, better with resting - Never had this chest pain before - Had more pain yesterday and called the clinic to get appointment - Gets short of breath intermittently  - Father diagnosed with CHF and Afib - Did not come to the ER prior to this due to concern for family distress   PERTINENT  PMH / PSH: Former Smoker  OBJECTIVE:   BP 116/66   Pulse 98   Wt 207 lb (93.9 kg)   SpO2 98%   BMI 31.47 kg/m   Gen: appears mildly anxious and unwell CV: RRR, no m/r/g appreciated, no peripheral edema.  Pulm: CTAB, no wheezes/crackles. Slightly increased labored breathing and inability to take full breaths GI: soft, non-tender, non-distended MSK: Sharp pain when palpating the left chest wall (not the same pain patient having) EKG: NSR, similar to prior EKG  The 10-year ASCVD risk score (Arnett DK, et al., 2019) is: 3%   Values used to calculate the score:     Age: 47 years     Sex: Male     Is Non-Hispanic African American: No     Diabetic: No     Tobacco smoker: Yes     Systolic Blood Pressure: 530 mmHg     Is BP treated: No     HDL Cholesterol: 39 mg/dL     Total Cholesterol: 136 mg/dL  ASSESSMENT/PLAN:   Chest pain Chest pain x2 weeks with concerns for ischemic symptoms given the worsening with exertion and improvement with rest. Has associated shortness of breath and significant fatigue. Pain has been intermittently worsening and feel that it is appropriate for patient to have full evaluation in the ER. EKG is reassuring at this time, but given the description this is technically an unstable angina; ASCVD risk is 3% based on calculator. Patient is agreeable and reports that his wife is outside and will take him to the ER. Called charge nurse to notify her that he will be coming in for evaluation and  patient was given '325mg'$  ASA.     Rise Patience, [place]

## 2022-11-08 ENCOUNTER — Telehealth: Payer: Self-pay

## 2022-11-08 DIAGNOSIS — R079 Chest pain, unspecified: Secondary | ICD-10-CM

## 2022-11-08 NOTE — Telephone Encounter (Signed)
Attempted to call patient back regarding the chest pain but there was no answer and patient had a VM box that was full.  Patient should be fine to start ASA '81mg'$  just in case. Referral to cardiology was placed for patient. Already has ER precautions.   Smrithi Pigford, DO

## 2022-11-08 NOTE — Telephone Encounter (Signed)
Patient calls nurse line regarding yesterday's visit. Patient went to the ED yesterday for chest pain, however, was never seen by provider in ED. He reports that he left after waiting for 3.5 hours.   Patient reports that he is feeling better now. He reports that he took a 325 mg tablet of aspirin around 1330.  Denies current chest pain, shortness of breath or arm pain.   He is asking if he should be on a daily dose of aspirin. Also requesting a cardiology referral.   Provided with strict ED precautions.   Talbot Grumbling, RN

## 2022-11-09 NOTE — Telephone Encounter (Signed)
Patient returns call to nurse line.   Patient advised to start Aspirin '81mg'$  daily.   Patient advised of Cardiology referral. Number given to patient to [facility] to call for an apt.   He reports feeling "ok today." ED precautions given.   Patient will reach out to Cardiology today.

## 2022-11-13 ENCOUNTER — Ambulatory Visit: Payer: Medicare HMO | Attending: Cardiology | Admitting: Cardiology

## 2022-11-13 ENCOUNTER — Other Ambulatory Visit: Payer: Self-pay

## 2022-11-13 ENCOUNTER — Emergency Department (HOSPITAL_COMMUNITY)
Admission: EM | Admit: 2022-11-13 | Discharge: 2022-11-13 | Disposition: A | Discharge status: Home / Self Care | Payer: Medicare HMO | Attending: Emergency Medicine | Admitting: Emergency Medicine

## 2022-11-13 ENCOUNTER — Encounter (HOSPITAL_COMMUNITY): Payer: Self-pay

## 2022-11-13 ENCOUNTER — Encounter: Payer: Self-pay | Admitting: Cardiology

## 2022-11-13 ENCOUNTER — Other Ambulatory Visit: Payer: Self-pay | Admitting: Cardiology

## 2022-11-13 ENCOUNTER — Emergency Department (HOSPITAL_COMMUNITY): Payer: Medicare HMO

## 2022-11-13 VITALS — BP 108/72 | HR 95 | Ht 68.0 in | Wt 210.6 lb

## 2022-11-13 DIAGNOSIS — R079 Chest pain, unspecified: Secondary | ICD-10-CM

## 2022-11-13 DIAGNOSIS — J45909 Unspecified asthma, uncomplicated: Secondary | ICD-10-CM | POA: Diagnosis not present

## 2022-11-13 DIAGNOSIS — I10 Essential (primary) hypertension: Secondary | ICD-10-CM | POA: Insufficient documentation

## 2022-11-13 DIAGNOSIS — Z7982 Long term (current) use of aspirin: Secondary | ICD-10-CM | POA: Diagnosis not present

## 2022-11-13 DIAGNOSIS — Z1152 Encounter for screening for COVID-19: Secondary | ICD-10-CM | POA: Diagnosis not present

## 2022-11-13 DIAGNOSIS — R0789 Other chest pain: Secondary | ICD-10-CM | POA: Diagnosis not present

## 2022-11-13 DIAGNOSIS — R0602 Shortness of breath: Secondary | ICD-10-CM | POA: Insufficient documentation

## 2022-11-13 DIAGNOSIS — Z79899 Other long term (current) drug therapy: Secondary | ICD-10-CM | POA: Insufficient documentation

## 2022-11-13 LAB — CBC
HCT: 50.4 % (ref 39.0–52.0)
Hemoglobin: 17.9 g/dL — ABNORMAL HIGH (ref 13.0–17.0)
MCH: 32.3 pg (ref 26.0–34.0)
MCHC: 35.5 g/dL (ref 30.0–36.0)
MCV: 90.8 fL (ref 80.0–100.0)
Platelets: 220 10*3/uL (ref 150–400)
RBC: 5.55 MIL/uL (ref 4.22–5.81)
RDW: 12.5 % (ref 11.5–15.5)
WBC: 5.5 10*3/uL (ref 4.0–10.5)
nRBC: 0 % (ref 0.0–0.2)

## 2022-11-13 LAB — BASIC METABOLIC PANEL
Anion gap: 11 (ref 5–15)
BUN: 18 mg/dL (ref 6–20)
CO2: 19 mmol/L — ABNORMAL LOW (ref 22–32)
Calcium: 9.4 mg/dL (ref 8.9–10.3)
Chloride: 107 mmol/L (ref 98–111)
Creatinine, Ser: 0.95 mg/dL (ref 0.61–1.24)
GFR, Estimated: >60 mL/min (ref >60)
Glucose, Bld: 95 mg/dL (ref 70–99)
Potassium: 4 mmol/L (ref 3.5–5.1)
Sodium: 137 mmol/L (ref 135–145)

## 2022-11-13 LAB — RESP PANEL BY RT-PCR (RSV, FLU A&B, COVID)  RVPGX2
Influenza A by PCR: NEGATIVE
Influenza B by PCR: NEGATIVE
Resp Syncytial Virus by PCR: NEGATIVE
SARS Coronavirus 2 by RT PCR: NEGATIVE

## 2022-11-13 LAB — TROPONIN I (HIGH SENSITIVITY)
Troponin I (High Sensitivity): 4 ng/L (ref <18)
Troponin I (High Sensitivity): 4 ng/L (ref <18)

## 2022-11-13 MED ORDER — FAMOTIDINE IN NACL 20-0.9 MG/50ML-% IV SOLN
20.0000 mg | Freq: Once | INTRAVENOUS | Status: AC
  Administered 2022-11-13: 20 mg via INTRAVENOUS
  Filled 2022-11-13: qty 50

## 2022-11-13 MED ORDER — FAMOTIDINE 20 MG PO TABS: 20.0000 mg | ORAL_TABLET | Freq: Every day | ORAL | 0 refills | Status: DC

## 2022-11-13 MED ORDER — SUCRALFATE 1 GM/10ML PO SUSP
1.0000 g | Freq: Once | ORAL | Status: AC
  Administered 2022-11-13: 1 g via ORAL
  Filled 2022-11-13: qty 10

## 2022-11-13 MED ORDER — SUCRALFATE 1 G PO TABS: 1.0000 g | ORAL_TABLET | Freq: Three times a day (TID) | ORAL | 0 refills | Status: DC

## 2022-11-13 NOTE — ED Notes (Signed)
Patient reports feeling much better now that he has had medications.

## 2022-11-13 NOTE — Progress Notes (Signed)
Cardiology CONSULT Note    Date:  11/13/2022   ID:  Blima Ledger., DOB Sep 17, 1976, MRN [id number]  PCP:  Ezequiel Essex, MD  Cardiologist:  Fransico Him, MD   Chief Complaint  Patient presents with   New Patient (Initial Visit)    Chest pain    History of Present Illness:  Matthew Meddaugh. is a 47 y.o. male who is being seen today for the evaluation of Chest pain at the request of Matthew Wade, [place], Matthew Wade.  This is a 47yo male with a hx of asthma, HLD and seizure disorder who is referred for evaluation of chest pain. He tells me that the CP started about 3 weeks ago.  He describes it as a sharp pain and tightness to the left of the sternum and radiates into the epigastrium.  Rubbing his sternum helps some.  The pain is worse with activity but also has the pain at rest as well. He will get nauseated after the gets the pain.  He denies any diaphoresis but will get SOB.  After the pain subsides he feels exhausted.  The pain usually lasts 2-3 min and then resolves.  He used to smoke but quit 2 years ago.  His father has atrial fibrillation, grandparents with CHF .  EKG at PCP office was nonischemic.   Past Medical History:  Diagnosis Date   Asthma 06/15/2014   Hyperlipemia    Seizures ([place])    per pt "granny seizure as child"    Past Surgical History:  Procedure Laterality Date   COLONOSCOPY WITH PROPOFOL N/A 02/08/2016   Procedure: COLONOSCOPY WITH PROPOFOL;  Surgeon: Manya Silvas, MD;  [facility] ENDOSCOPY;  Service: Endoscopy;  Laterality: N/A;   ESOPHAGOGASTRODUODENOSCOPY (EGD) WITH PROPOFOL N/A 02/08/2016   Procedure: ESOPHAGOGASTRODUODENOSCOPY (EGD) WITH PROPOFOL;  Surgeon: Manya Silvas, MD;  Location: [facility] ENDOSCOPY;  Service: Endoscopy;  Laterality: N/A;   HAND SURGERY     Right "pinky" finger amputation    Current Medications: Current Meds  Medication Sig   aspirin EC 81 MG tablet Take 81 mg by mouth in the morning and at bedtime. Swallow whole.    Famotidine (ZANTAC 360 PO) Take by mouth. Takes daily as needed for heartburn   ibuprofen (ADVIL) 200 MG tablet Take 200 mg by mouth daily.   levocetirizine (XYZAL) 5 MG tablet Take by mouth.   ondansetron (ZOFRAN-ODT) 4 MG disintegrating tablet Take 1 tablet (4 mg total) by mouth every 8 (eight) hours as needed for nausea or vomiting.   rosuvastatin (CRESTOR) 20 MG tablet Take 1 tablet (20 mg total) by mouth daily. Please go to PCP for lab work.    Allergies:   Diphenhydramine   Social History   Socioeconomic History   Marital status: Married    Spouse name: Not on file   Number of children: Not on file   Years of education: Not on file   Highest education level: Not on file  Occupational History   Occupation: Disability    Comment: Legally blind (genetic)  Tobacco Use   Smoking status: Former    Packs/day: 0.25    Years: 14.00    Total pack years: 3.50    Types: Cigarettes    Quit date: 03/18/2021    Years since quitting: 1.6    Passive exposure: Past   Smokeless tobacco: Never  Substance and Sexual Activity   Alcohol use: Yes    Comment: socail   Drug use: Yes  Types: Marijuana    Comment: years ago   Sexual activity: Yes    Partners: Female  Other Topics Concern   Not on file  Social History Narrative   Not on file   Social Determinants of Health   Financial Resource Strain: Not on file  Food Insecurity: Not on file  Transportation Needs: Not on file  Physical Activity: Not on file  Stress: Not on file  Social Connections: Not on file     Family History:  The patient's family history includes Colon cancer (age of onset: 68) in his father.   ROS:   Please see the history of present illness.    ROS All other systems reviewed and are negative.      No data to display             PHYSICAL EXAM:   VS:  Ht '5\' 8"'$  (1.727 m)   Wt 210 lb 9.6 oz (95.5 kg)   BMI 32.02 kg/m    GEN: Well nourished, well developed, in no acute distress  HEENT: normal   Neck: no JVD, carotid bruits, or masses Cardiac: RRR; no murmurs, rubs, or gallops,no edema.  Intact distal pulses bilaterally.  Respiratory:  clear to auscultation bilaterally, normal work of breathing GI: soft, nontender, nondistended, + BS MS: no deformity or atrophy  Skin: warm and dry, no rash Neuro:  Alert and Oriented x 3, Strength and sensation are intact Psych: euthymic mood, full affect  Wt Readings from Last 3 Encounters:  11/13/22 210 lb 9.6 oz (95.5 kg)  11/07/22 205 lb (93 kg)  11/07/22 207 lb (93.9 kg)      Studies/Labs Reviewed:   EKG:  EKG is  not ordered today.   Recent Labs: 11/07/2022: BUN 17; Creatinine, Ser 1.01; Hemoglobin 17.9; Platelets 238; Potassium 4.5; Sodium 139   Lipid Panel    Component Value Date/Time   CHOL 136 05/18/2021 1545   TRIG 180 (H) 05/18/2021 1545   HDL 39 (L) 05/18/2021 1545   CHOLHDL 3.5 05/18/2021 1545   LDLCALC 67 05/18/2021 1545    Additional studies/ records that were reviewed today include:  none    ASSESSMENT:    1. Chest pain of uncertain etiology      PLAN:  In order of problems listed above:  Chest pain -his pain has typical and atypical components to it. It is very sharp and stabbing but also squeezing.  He is having it multiple times daily lasting a few minutes at a time. -EKG at PCP office was nonischemic.  -his CRFs include remote hx of tobacco and HLD -He had several episodes of CP while in the office today that were squeezing and was at the ER last week with normal hsTrop -I have recommended that we send him over to the ER for further evaluation.  Will check hsTrop and if normal then get coronary CTA to define coronary anatomy -check 2D echo to rule out pericardial effusion  Time Spent: 20 minutes total time of encounter, including 15 minutes spent in face-to-face patient care on the date of this encounter. This time includes coordination of care and counseling regarding above mentioned problem list.  Remainder of non-face-to-face time involved reviewing chart documents/testing relevant to the patient encounter and documentation in the medical record. I have independently reviewed documentation from referring provider  Medication Adjustments/Labs and Tests Ordered: Current medicines are reviewed at length with the patient today.  Concerns regarding medicines are outlined above.  Medication changes, Labs and  Tests ordered today are listed in the Patient Instructions below.  There are no Patient Instructions on file for this visit.   Signed, Fransico Him, MD  11/13/2022 8:51 AM    [address], [place], [place]  [address] Phone: [phone]; Fax: [phone]

## 2022-11-13 NOTE — ED Notes (Signed)
Patient c/o SOB x3 weeks, currently 98% on room air. Provider at bedside

## 2022-11-13 NOTE — Patient Instructions (Signed)
Please go straight to the Emergency Room.

## 2022-11-13 NOTE — Discharge Instructions (Addendum)
I am sending in Carafate tablets to take daily as needed for abdominal pain I am sending in famotidine 20 mg daily   Your cardiology team will follow-up to arrange imaging studies that were going to be performed today.  Please use the provided information above to follow-up with a GI team.

## 2022-11-13 NOTE — Progress Notes (Signed)
    Matthew Wade. is a 47 y.o. male with hx of asthma, HLD and seizure disorder who is being seen 11/13/2022 for the evaluation of chest pain x3 weeks. Patient was seen by Dr. Radford Pax for initial cardiology evaluation today. He reports that his chest pain began about 3 weeks ago.  Pain is described as sharp in nature with tightness to the left of his sternum, lasting 2 to 3 minutes with subsequent spontaneous resolution. Pain reportedly radiates into the epigastrium.  Patient advises that rubbing his sternum does helped relieve some of his discomfort.  Pain does seem to be worse with activity but also can occur while at rest. He additionally says that symptoms are worsened by eating spicy foods. Patient endorses concurrent nausea and shortness of breath with pain.  He denies diaphoresis.  Patient reports history of tobacco use, says that he quit smoking 2 years ago.  Family history is notable for his father having atrial fibrillation, grandparents with congestive heart failure.  Patient was seen by his PCP for symptoms initially on 1/10, subsequently sent to the emergency department from the office for further evaluation.  Patient had lab work checked including troponin on 1/10, found negative at 3.  Appears the patient left the emergency department shortly after having initial triage evaluation and blood work.   In the ED today, patient's troponin found to be 4. ECG without acute ischemic changes. Per Dr. Theodosia Blender recommendation, cardiac CTA and echocardiogram ordered. Patient was given famotidine and sucralfate just prior to my examination in the ED and he reports that pain that has been bothering him for the past 3 weeks has resolved. He is extremely frustrated with his position in the hallway and length of time spent in the ED. Patient states that if his CT and ultrasound cannot happen in the next few hours, he will leave against medical advice. Discussed that without these tests, we are unable to rule  out cardiac etiology of symptoms. Patient is of clear mind and voices understanding that further cardiac workup is strongly recommended, but continues to express a desire to leave. Patient understands return precautions and is aware that leaving without full workup could result in bad health outcome, up to death.  I will arrange outpatient cardiology follow up and order outpatient coronary CTA and ultrasound.  Physical Exam Vitals reviewed.  Constitutional:      Appearance: He is well-developed.  HENT:     Head: Normocephalic and atraumatic.  Eyes:     Pupils: Pupils are equal, round, and reactive to light.  Cardiovascular:     Rate and Rhythm: Normal rate and regular rhythm.     Heart sounds: Normal heart sounds.  Pulmonary:     Effort: Pulmonary effort is normal.     Breath sounds: Normal breath sounds.  Abdominal:     General: Bowel sounds are normal.     Palpations: Abdomen is soft.  Musculoskeletal:     Cervical back: Normal range of motion and neck supple.  Skin:    General: Skin is warm and dry.     Capillary Refill: Capillary refill takes less than 2 seconds.  Neurological:     Mental Status: He is alert and oriented to person, place, and time.

## 2022-11-13 NOTE — ED Provider Triage Note (Signed)
Emergency Medicine Provider Triage Evaluation Note  Matthew Wade. , a 47 y.o. male  was evaluated in triage.  Patient says that he is "here to get a CAT scan of his chest and be admitted."  Reports that for the past couple of days he has chest pain.  He thought it was reflux so he took PPI which helped him but ultimately the pain came back.  Says that it is sharp.  He went to his cardiologist today.  They said to come to the emergency department for a CAT scan of his chest.   Looking at the cardiology note this morning it appears as though patient was sent to the emergency department to get serial troponins.  If these were normal they were hoping to get a cardiac CTA, potentially inpatient?  Also reported their plan to check an echo, potentially inpatient?  Will require cardio consult   Review of Systems  Positive:  Negative:   Physical Exam  BP (!) 130/98 (BP Location: Right Arm)   Pulse 91   Temp (!) 97.5 F (36.4 C)   Resp 16   SpO2 94%  Gen:   Awake, no distress   Resp:  Normal effort  MSK:   Moves extremities without difficulty  Other:  RRR  Medical Decision Making  Medically screening exam initiated at 10:32 AM.  Appropriate orders placed.  Matthew Wade. was informed that the remainder of the evaluation will be completed by another provider, this initial triage assessment does not replace that evaluation, and the importance of remaining in the ED until their evaluation is complete.     Rhae Hammock, PA-C 11/13/22 1035

## 2022-11-13 NOTE — ED Triage Notes (Signed)
Pt came in POV d/t CP that started last week, denies radiation but endorses some difficulty breathing from it. States that he was at his cardiologist office & they sent him here & states that he has unstable angina. A/Ox4, 5/10 sharp CP while in triage.

## 2022-11-13 NOTE — ED Provider Notes (Signed)
[facility] EMERGENCY DEPARTMENT Provider Note   CSN: [id number] Arrival date & time: 11/13/22  0940     History  Chief Complaint  Patient presents with   Chest Pain    Matthew Wade. is a 47 y.o. male PMH of asthma, hyperlipidemia, seizure disorder who is coming in for 3 weeks of chest pain and shortness of breath that have been worsening in the last week.  He was seen and sent by his cardiologist this morning.  Pain as a sharp and also stabbing as well as squeezing sensation.  He says it is worse when walking.  He had previously tried an antacid once which did help for a little while but the pain did return.  He has the pain with activity and with rest.  He does get shortness of breath with exertion.  Denies any leg swelling.  He has had multiple events during the days that lasts around 2 to 3 minutes and then resolved.  Says it comes and goes.  Denies any history of heart attack, pulmonary embolism or DVT.  He does take an aspirin daily.  He denies any history of fever, cough, emesis, diarrhea or constipation.  He does have epigastric pain.    Chest Pain Associated symptoms: no abdominal pain, no back pain, no cough, no fever, no palpitations, no shortness of breath and no vomiting        Home Medications Prior to Admission medications   Medication Sig Start Date End Date Taking? Authorizing Provider  aspirin EC 81 MG tablet Take 81 mg by mouth in the morning and at bedtime. Swallow whole.    [provider]  famotidine (ZANTAC 360 MAX ST) 20 MG tablet Take 1 tablet (20 mg total) by mouth daily. Takes daily as needed for heartburn 11/13/22 12/13/22  Gerrit Heck, MD  ibuprofen (ADVIL) 200 MG tablet Take 200 mg by mouth daily.    [provider]  levocetirizine (XYZAL) 5 MG tablet Take by mouth. 03/20/16   [provider]  ondansetron (ZOFRAN-ODT) 4 MG disintegrating tablet Take 1 tablet (4 mg total) by mouth every 8 (eight) hours  as needed for nausea or vomiting. 08/29/22   Gerrit Heck, MD  rosuvastatin (CRESTOR) 20 MG tablet Take 1 tablet (20 mg total) by mouth daily. Please go to PCP for lab work. 09/18/22   Ezequiel Essex, MD  sucralfate (CARAFATE) 1 g tablet Take 1 tablet (1 g total) by mouth 4 (four) times daily -  with meals and at bedtime. 11/13/22  Yes Gerrit Heck, MD      Allergies    Diphenhydramine    Review of Systems   Review of Systems  Constitutional:  Negative for chills and fever.  HENT:  Negative for ear pain and sore throat.   Eyes:  Negative for pain and visual disturbance.  Respiratory:  Negative for cough and shortness of breath.   Cardiovascular:  Positive for chest pain. Negative for palpitations.  Gastrointestinal:  Negative for abdominal pain and vomiting.  Genitourinary:  Negative for dysuria and hematuria.  Musculoskeletal:  Negative for arthralgias and back pain.  Skin:  Negative for color change and rash.  Neurological:  Negative for seizures and syncope.  All other systems reviewed and are negative.   Physical Exam Updated Vital Signs BP 130/87   Pulse 69   Temp 98.7 F (37.1 C)   Resp 20   SpO2 97%  Physical Exam Vitals and nursing note reviewed.  Constitutional:  Appearance: He is well-developed. He is not toxic-appearing.     Comments: Does appear to be in pain  HENT:     Head: Normocephalic and atraumatic.  Eyes:     Conjunctiva/sclera: Conjunctivae normal.  Cardiovascular:     Rate and Rhythm: Normal rate and regular rhythm.     Pulses:          Carotid pulses are  on the right side with bruit and  on the left side with bruit.      Radial pulses are 2+ on the right side and 2+ on the left side.     Heart sounds: No murmur heard. Pulmonary:     Effort: Pulmonary effort is normal. No respiratory distress.     Breath sounds: Normal breath sounds. No wheezing, rhonchi or rales.  Chest:     Chest wall: No tenderness or crepitus.  Abdominal:      Palpations: Abdomen is soft.     Tenderness: There is abdominal tenderness.     Comments: Tenderness to palpation in epigastric region  Musculoskeletal:        General: No swelling.     Cervical back: Neck supple.     Right lower leg: No tenderness. No edema.     Left lower leg: No tenderness. No edema.  Skin:    General: Skin is warm and dry.     Capillary Refill: Capillary refill takes less than 2 seconds.  Neurological:     Mental Status: He is alert.  Psychiatric:        Mood and Affect: Mood normal.     ED Results / Procedures / Treatments   Labs (all labs ordered are listed, but only abnormal results are displayed) Labs Reviewed  BASIC METABOLIC PANEL - Abnormal; Notable for the following components:      Result Value   CO2 19 (*)    All other components within normal limits  CBC - Abnormal; Notable for the following components:   Hemoglobin 17.9 (*)    All other components within normal limits  RESP PANEL BY RT-PCR (RSV, FLU A&B, COVID)  RVPGX2  TROPONIN I (HIGH SENSITIVITY)  TROPONIN I (HIGH SENSITIVITY)    EKG EKG Interpretation  Date/Time:  Tuesday November 13 2022 09:54:22 EST Ventricular Rate:  88 PR Interval:  130 QRS Duration: 92 QT Interval:  338 QTC Calculation: 408 R Axis:   86 Text Interpretation: Normal sinus rhythm with sinus arrhythmia Normal ECG Confirmed by Carmin Muskrat [phone]) on 11/13/2022 2:01:44 PM  Radiology DG Chest 2 View  Result Date: 11/13/2022 CLINICAL DATA:  Chest pain, short of breath EXAM: CHEST - 2 VIEW COMPARISON:  None Available. FINDINGS: Normal mediastinum and cardiac silhouette. Normal pulmonary vasculature. No evidence of effusion, infiltrate, or pneumothorax. No acute bony abnormality. IMPRESSION: No acute cardiopulmonary process. Electronically Signed   By: Suzy Bouchard M.D.   On: 11/13/2022 11:08    Procedures Procedures   Medications Ordered in ED Medications  famotidine (PEPCID) IVPB 20 mg premix (20 mg  Intravenous New Bag/Given 11/13/22 1507)  sucralfate (CARAFATE) 1 GM/10ML suspension 1 g (1 g Oral Given 11/13/22 1504)    ED Course/ Medical Decision Making/ A&P                           Medical Decision Making 47 year old PMH of hypertension hyperlipidemia here for 3 weeks of stabbing, squeezing, intermittent chest pain that has atypical atypical components.  Seen by  cardiology this morning and sent over from their office. Initial troponin negligible of 4. EKG with no ischemic changes. CXR without cardiopulmonary issues. I have consulted cardiology who says they will see the patient and consider coronary CTA and/or echocardiogram per Dr. Theodosia Blender note.  Given patient's moderate tenderness to palpation of epigastric region, ordered IV Pepcid and Carafate oral suspension for possible reflux/gastritis.  Of note, patient does have EGD in 2017 which had biopsies showing gastritis and duodenitis negative for H pylori. He says he has had a history of H pylori though. Denies any hematochezia or dark tarry stools Patient does take NSAIDs daily and aspirin 81 mg which could be contributing to some of this pain.  Respiratory swab negative.  No drop in hemoglobin on CBC or leukocytosis.  BMP with no acute electrolyte derangements. Cardiology to see and have ordered coronary CTA and echocardiogram for evaluation. Consider GI follow up outpatient if patient has negative cardiac workup.  Patient handed off to Dr. Vanita Panda who will discuss with oncoming team.  Risk Prescription drug management.    Final Clinical Impression(s) / ED Diagnoses Final diagnoses:  Atypical chest pain    Rx / DC Orders ED Discharge Orders          Ordered    sucralfate (CARAFATE) 1 g tablet  3 times daily with meals & bedtime        11/13/22 1521    famotidine (ZANTAC 360 MAX ST) 20 MG tablet  Daily        11/13/22 1521              Gerrit Heck, MD 11/13/22 1522    Carmin Muskrat, MD 11/13/22 1535     Carmin Muskrat, MD 11/13/22 [phone]

## 2022-11-16 ENCOUNTER — Other Ambulatory Visit (HOSPITAL_COMMUNITY): Payer: Self-pay | Admitting: *Deleted

## 2022-11-16 ENCOUNTER — Encounter (HOSPITAL_COMMUNITY): Payer: Self-pay

## 2022-11-16 ENCOUNTER — Telehealth (HOSPITAL_COMMUNITY): Payer: Self-pay | Admitting: *Deleted

## 2022-11-16 MED ORDER — IVABRADINE HCL 7.5 MG PO TABS: ORAL_TABLET | ORAL | 0 refills | Status: DC

## 2022-11-16 NOTE — Telephone Encounter (Signed)
Reaching out to patient to offer assistance regarding upcoming cardiac imaging study; pt verbalizes understanding of appt date/time, parking situation and where to check in, pre-test NPO status and medications ordered, and verified current allergies; name and call back number provided for further questions should they arise  Gordy Clement RN Navigator Cardiac Imaging [facility] [phone] office [phone] cell  Patient to take '15mg'$  ivabradine two hours prior to his cardiac CT scan.  He is aware to arrive at 3pm.

## 2022-11-19 ENCOUNTER — Ambulatory Visit (HOSPITAL_COMMUNITY)
Admission: RE | Admit: 2022-11-19 | Discharge: 2022-11-19 | Disposition: A | Discharge status: Home / Self Care | Payer: Medicare HMO | Source: Ambulatory Visit | Attending: Cardiology | Admitting: Cardiology

## 2022-11-19 ENCOUNTER — Other Ambulatory Visit (HOSPITAL_COMMUNITY): Payer: Self-pay | Admitting: Emergency Medicine

## 2022-11-19 ENCOUNTER — Ambulatory Visit (HOSPITAL_BASED_OUTPATIENT_CLINIC_OR_DEPARTMENT_OTHER)
Admission: RE | Admit: 2022-11-19 | Discharge: 2022-11-19 | Disposition: A | Discharge status: Home / Self Care | Payer: Medicare HMO | Source: Ambulatory Visit | Attending: Cardiology | Admitting: Cardiology

## 2022-11-19 DIAGNOSIS — R931 Abnormal findings on diagnostic imaging of heart and coronary circulation: Secondary | ICD-10-CM

## 2022-11-19 DIAGNOSIS — R079 Chest pain, unspecified: Secondary | ICD-10-CM | POA: Insufficient documentation

## 2022-11-19 DIAGNOSIS — I2510 Atherosclerotic heart disease of native coronary artery without angina pectoris: Secondary | ICD-10-CM | POA: Insufficient documentation

## 2022-11-19 MED ORDER — IOHEXOL 350 MG/ML SOLN
100.0000 mL | Freq: Once | INTRAVENOUS | Status: AC | PRN
  Administered 2022-11-19: 100 mL via INTRAVENOUS

## 2022-11-19 MED ORDER — NITROGLYCERIN 0.4 MG SL SUBL
SUBLINGUAL_TABLET | SUBLINGUAL | Status: AC
  Filled 2022-11-19: qty 2

## 2022-11-19 MED ORDER — METOPROLOL TARTRATE 5 MG/5ML IV SOLN: 5.0000 mg | Freq: Once | INTRAVENOUS | Status: AC

## 2022-11-19 MED ORDER — METOPROLOL TARTRATE 5 MG/5ML IV SOLN
5.0000 mg | Freq: Once | INTRAVENOUS | Status: AC
  Administered 2022-11-19: 5 mg via INTRAVENOUS

## 2022-11-19 MED ORDER — NITROGLYCERIN 0.4 MG SL SUBL
0.8000 mg | SUBLINGUAL_TABLET | SUBLINGUAL | Status: DC | PRN
  Administered 2022-11-19: 0.8 mg via SUBLINGUAL

## 2022-11-19 MED ORDER — METOPROLOL TARTRATE 5 MG/5ML IV SOLN
INTRAVENOUS | Status: AC
  Administered 2022-11-19: 5 mg via INTRAVENOUS
  Filled 2022-11-19: qty 10

## 2022-11-20 ENCOUNTER — Encounter: Payer: Self-pay | Admitting: Cardiology

## 2022-11-20 ENCOUNTER — Telehealth: Payer: Self-pay

## 2022-11-20 NOTE — Telephone Encounter (Signed)
-----  Message from Sueanne Margarita, MD sent at 11/20/2022  8:39 AM EST ----- Coronary CTA showed a coronary calcium score of 36 which was 90th percentile for age and gender which suggests a high risk for future cardiac events and we need to be aggressive in treating risk factors.  He has moderate noncalcified plaque of 50 to 69%  stenosis o in the proximal first diagonal and minimal stenosis of 1-24% in the proximal LAD.  By FFR the diagonal is not hemodynamically significant meaning that flow is normal in that area.  Please have him come in for a fasting lipid panel.  I would like it to be an NMR panel with ALT.  Also order hemoglobin A1c.  Continue aspirin 81 mg daily and statin therapy.  Follow-up with me in 1 year or sooner if he starts having chest pain

## 2022-11-20 NOTE — Telephone Encounter (Signed)
I spoke with the pt and he verbalized understanding of his results... he asked to have the labs at his OV with the APP on 11/27/22... he says he would like to further discuss at his appt.

## 2022-11-21 ENCOUNTER — Telehealth: Payer: Self-pay

## 2022-11-21 NOTE — Telephone Encounter (Signed)
-----  Message from Sueanne Margarita, MD sent at 11/20/2022  9:14 PM EST ----- No flow limiting lesions

## 2022-11-21 NOTE — Telephone Encounter (Signed)
-----  Message from Gershon Crane, LPN sent at 3/61/4431 11:49 AM EST -----  ----- Message ----- From: Sueanne Margarita, MD Sent: 11/21/2022   8:22 AM EST To: Ezequiel Essex, MD; [facility] Triage  Has he had any further chest pain

## 2022-11-21 NOTE — Telephone Encounter (Signed)
Called patient and asked if he had experienced any further chest pain since his visit with Dr. Radford Pax on 11/13/22. He states that his shortness of breath is unchanged, but his chest tightness/pressure on exertion has been improved with carafate. Patient has visit with Ambrose Pancoast on 11/27/22 and will do cholesterol labs at that time.

## 2022-11-21 NOTE — Telephone Encounter (Signed)
Patient is returning call.

## 2022-11-22 ENCOUNTER — Other Ambulatory Visit: Payer: Self-pay

## 2022-11-22 MED ORDER — SUCRALFATE 1 G PO TABS: 1.0000 g | ORAL_TABLET | Freq: Three times a day (TID) | ORAL | 0 refills | Status: DC

## 2022-11-23 NOTE — Telephone Encounter (Signed)
Please see phone note from 11/21/22. Patient has upcoming visit with Elvin So, NP on 11/27/22 for results review and has lab work scheduled at that time.

## 2022-11-26 NOTE — Progress Notes (Unsigned)
Office Visit    Patient Name: Matthew Wade. Date of Encounter: 11/26/2022  Primary Care Provider:  Ezequiel Essex, MD Primary Cardiologist:  None Primary Electrophysiologist: None  Chief Complaint    Matthew Ledger. is a 47 y.o. male with PMH of nonobstructive CAD, HLD, seizures, asthma, tobacco abuse who presents today for review of coronary CTA results.  Past Medical History    Past Medical History:  Diagnosis Date   Asthma 06/15/2014   CAD (coronary artery disease), native coronary artery 10/2022   Coronary CTA showed a coronary calcium score of 36. moderate noncalcified plaque of 50 to 69%  stenosis o in the proximal first diagonal and minimal stenosis of 1-24% in the proximal LAD.  By FFR the diagonal is not hemodynamically significant   Hyperlipemia    Seizures ([place])    per pt "granny seizure as child"   Past Surgical History:  Procedure Laterality Date   COLONOSCOPY WITH PROPOFOL N/A 02/08/2016   Procedure: COLONOSCOPY WITH PROPOFOL;  Surgeon: Manya Silvas, MD;  Location: [facility] ENDOSCOPY;  Service: Endoscopy;  Laterality: N/A;   ESOPHAGOGASTRODUODENOSCOPY (EGD) WITH PROPOFOL N/A 02/08/2016   Procedure: ESOPHAGOGASTRODUODENOSCOPY (EGD) WITH PROPOFOL;  Surgeon: Manya Silvas, MD;  Location: [facility] ENDOSCOPY;  Service: Endoscopy;  Laterality: N/A;   HAND SURGERY     Right "pinky" finger amputation    Allergies  Allergies  Allergen Reactions   Diphenhydramine     Swelling and hives    History of Present Illness    Matthew Hoyt.  is a 47 year old male with the above mention past medical history who presents today for follow-up of recent chest pain and coronary CTA.  Matthew Wade was seen for initial consult by Dr. Radford Pax on 11/13/2022 for complaint of chest pain.  He presented to the ED on 11/07/22 with complaint of chest pain and was initially seen but not workup due to excessive weight.  He was provided referral by PCP for cardiology workup.  During  his visit patient reported sharp pain with tightness to the left sternum that radiated into epigastrium x 3 weeks.  He reported nausea with his pain.  He reports exhaustion after the pain subsides that usually last 2 to 3 minutes.  He has family history of CHF in his grandparents and AF in his father.  EKG completed at his PCP was nonischemic and patient had several episodes while in the office with Dr. Radford Pax.  He was referred to the ED for further evaluation and serial troponins with plan to complete CTA if troponin is normal.  EKG in the ED was normal along with troponins.  Coronary CTA was ordered and revealed no flow-limiting lesions and no significant stenosis and LAD, LCx, RCA, or left main.  He had a calcium score of 36.9 and moderate noncalcified plaque in his first diagonal with FFR complete for further analysis that revealed no significant stenosis.  Since last being seen in the office patient reports***.  Patient denies chest pain, palpitations, dyspnea, PND, orthopnea, nausea, vomiting, dizziness, syncope, edema, weight gain, or early satiety.     ***Notes: -Patient needs lipids and LFTs Home Medications    Current Outpatient Medications  Medication Sig Dispense Refill   aspirin EC 81 MG tablet Take 81 mg by mouth in the morning and at bedtime. Swallow whole.     famotidine (ZANTAC 360 MAX ST) 20 MG tablet Take 1 tablet (20 mg total) by mouth daily. Takes daily  as needed for heartburn 30 tablet 0   ibuprofen (ADVIL) 200 MG tablet Take 200 mg by mouth daily.     ivabradine (CORLANOR) 7.5 MG TABS tablet Take tablets ('15mg'$ ) TWO hours prior to your cardiac CT scan. 2 tablet 0   levocetirizine (XYZAL) 5 MG tablet Take by mouth.     ondansetron (ZOFRAN-ODT) 4 MG disintegrating tablet Take 1 tablet (4 mg total) by mouth every 8 (eight) hours as needed for nausea or vomiting. 20 tablet 0   rosuvastatin (CRESTOR) 20 MG tablet Take 1 tablet (20 mg total) by mouth daily. Please go to PCP for lab  work. 90 tablet 0   sucralfate (CARAFATE) 1 g tablet Take 1 tablet (1 g total) by mouth 4 (four) times daily -  with meals and at bedtime. 120 tablet 0   No current facility-administered medications for this visit.     Review of Systems  Please see the history of present illness.    (+)*** (+)***  All other systems reviewed and are otherwise negative except as noted above.  Physical Exam    Wt Readings from Last 3 Encounters:  11/13/22 210 lb 9.6 oz (95.5 kg)  11/07/22 205 lb (93 kg)  11/07/22 207 lb (93.9 kg)   VQ:MGQQP were no vitals filed for this visit.,There is no height or weight on file to calculate BMI.  Constitutional:      Appearance: Healthy appearance. Not in distress.  Neck:     Vascular: JVD normal.  Pulmonary:     Effort: Pulmonary effort is normal.     Breath sounds: No wheezing. No rales. Diminished in the bases Cardiovascular:     Normal rate. Regular rhythm. Normal S1. Normal S2.      Murmurs: There is no murmur.  Edema:    Peripheral edema absent.  Abdominal:     Palpations: Abdomen is soft non tender. There is no hepatomegaly.  Skin:    General: Skin is warm and dry.  Neurological:     General: No focal deficit present.     Mental Status: Alert and oriented to person, place and time.     Cranial Nerves: Cranial nerves are intact.  EKG/LABS/Other Studies Reviewed    ECG personally reviewed by me today - ***  Risk Assessment/Calculations:   {Does this patient have ATRIAL FIBRILLATION?:[id number]}        Lab Results  Component Value Date   WBC 5.5 11/13/2022   HGB 17.9 (H) 11/13/2022   HCT 50.4 11/13/2022   MCV 90.8 11/13/2022   PLT 220 11/13/2022   Lab Results  Component Value Date   CREATININE 0.95 11/13/2022   BUN 18 11/13/2022   NA 137 11/13/2022   K 4.0 11/13/2022   CL 107 11/13/2022   CO2 19 (L) 11/13/2022   Lab Results  Component Value Date   ALT 20 01/06/2018   AST 19 01/06/2018   ALKPHOS 74 01/06/2018   BILITOT 0.5  01/06/2018   Lab Results  Component Value Date   CHOL 136 05/18/2021   HDL 39 (L) 05/18/2021   LDLCALC 67 05/18/2021   TRIG 180 (H) 05/18/2021   CHOLHDL 3.5 05/18/2021    Lab Results  Component Value Date   HGBA1C 5.8 (H) 12/22/2021    Assessment & Plan    1.  Nonobstructive CAD: -s/p cardiac CTA completed showing no significant stenosis with calcium score of 36.6 which is 90th percentile for age, gender suggesting high risk for future cardiac event. -Continue***  2.  Mixed hyperlipidemia: -Patient will need updated LFTs and lipids -Continue Crestor 20 mg daily  3.***  4.***      Disposition: Follow-up with None or APP in *** months {Are you ordering a CV Procedure (e.g. stress test, cath, DCCV, TEE, etc)?   Press F2        :[id number]}   Medication Adjustments/Labs and Tests Ordered: Current medicines are reviewed at length with the patient today.  Concerns regarding medicines are outlined above.   Signed, Mable Fill, Marissa Nestle, NP 11/26/2022, 2:03 PM [facility]  Note:  This document was prepared using Dragon voice recognition software and may include unintentional dictation errors.

## 2022-11-27 ENCOUNTER — Ambulatory Visit: Payer: Medicare HMO | Attending: Nurse Practitioner | Admitting: Nurse Practitioner

## 2022-11-27 ENCOUNTER — Encounter: Payer: Self-pay | Admitting: Nurse Practitioner

## 2022-11-27 VITALS — BP 112/80 | HR 88 | Ht 68.0 in | Wt 207.0 lb

## 2022-11-27 DIAGNOSIS — R079 Chest pain, unspecified: Secondary | ICD-10-CM | POA: Diagnosis not present

## 2022-11-27 DIAGNOSIS — E782 Mixed hyperlipidemia: Secondary | ICD-10-CM | POA: Diagnosis not present

## 2022-11-27 DIAGNOSIS — I2510 Atherosclerotic heart disease of native coronary artery without angina pectoris: Secondary | ICD-10-CM | POA: Diagnosis not present

## 2022-11-27 DIAGNOSIS — K219 Gastro-esophageal reflux disease without esophagitis: Secondary | ICD-10-CM

## 2022-11-27 MED ORDER — PANTOPRAZOLE SODIUM 20 MG PO TBEC: 20.0000 mg | DELAYED_RELEASE_TABLET | Freq: Every day | ORAL | 6 refills | Status: DC

## 2022-11-27 NOTE — Patient Instructions (Addendum)
Medication Instructions:  STOP Famotidine  START Pantoprazole '20mg'$  Take 1 tablet once a day *If you need a refill on your cardiac medications before your next appointment, please call your pharmacy*   Lab Work: [place] If you have labs (blood work) drawn today and your tests are completely normal, you will receive your results only by: [place] (if you have MyChart) OR A paper copy in the mail If you have any lab test that is abnormal or we need to change your treatment, we will call you to review the results.   Testing/Procedures: NONE ORDERED   Follow-Up: At [facility], you and your health needs are our priority.  As part of our continuing mission to provide you with exceptional heart care, we have created designated Provider Care Teams.  These Care Teams include your primary Cardiologist (physician) and Advanced Practice Providers (APPs -  Physician Assistants and Nurse Practitioners) who all work together to provide you with the care you need, when you need it.  We recommend signing up for the patient portal called "MyChart".  Sign up information is provided on this After Visit Summary.  MyChart is used to connect with patients for Virtual Visits (Telemedicine).  Patients are able to view lab/test results, encounter notes, upcoming appointments, etc.  Non-urgent messages can be sent to your provider as well.   To learn more about what you can do with MyChart, go to [email or web address].    Your next appointment:   3 month(s)  Provider:   Ambrose Pancoast, NP        Other Instructions

## 2022-11-28 ENCOUNTER — Ambulatory Visit (HOSPITAL_COMMUNITY): Payer: Medicare HMO | Attending: Cardiology

## 2022-11-28 DIAGNOSIS — R079 Chest pain, unspecified: Secondary | ICD-10-CM | POA: Diagnosis present

## 2022-11-28 LAB — HEPATIC FUNCTION PANEL
ALT: 33 IU/L (ref 0–44)
AST: 23 IU/L (ref 0–40)
Albumin: 4.7 g/dL (ref 4.1–5.1)
Alkaline Phosphatase: 92 IU/L (ref 44–121)
Bilirubin Total: 0.5 mg/dL (ref 0.0–1.2)
Bilirubin, Direct: 0.1 mg/dL (ref 0.00–0.40)
Total Protein: 7.4 g/dL (ref 6.0–8.5)

## 2022-11-28 LAB — LIPID PANEL
Chol/HDL Ratio: 3.9 ratio (ref 0.0–5.0)
Cholesterol, Total: 143 mg/dL (ref 100–199)
HDL: 37 mg/dL — ABNORMAL LOW
LDL Chol Calc (NIH): 85 mg/dL (ref 0–99)
Triglycerides: 118 mg/dL (ref 0–149)
VLDL Cholesterol Cal: 21 mg/dL (ref 5–40)

## 2022-11-28 LAB — ECHOCARDIOGRAM COMPLETE
Area-P 1/2: 3.99 cm2
S' Lateral: 2.7 cm

## 2022-12-06 ENCOUNTER — Other Ambulatory Visit (HOSPITAL_COMMUNITY): Payer: Medicare HMO

## 2022-12-21 ENCOUNTER — Other Ambulatory Visit: Payer: Self-pay | Admitting: Family Medicine

## 2022-12-21 DIAGNOSIS — E782 Mixed hyperlipidemia: Secondary | ICD-10-CM

## 2022-12-24 ENCOUNTER — Ambulatory Visit: Payer: Medicare HMO | Admitting: Student

## 2022-12-24 NOTE — Progress Notes (Deleted)
  SUBJECTIVE:   CHIEF COMPLAINT / HPI:   Cold Symptoms   PERTINENT  PMH / PSH: Asthma, CAD, HLD, GERD, seizures    OBJECTIVE:  There were no vitals taken for this visit. Physical Exam   ASSESSMENT/PLAN:  There are no diagnoses linked to this encounter. No follow-ups on file. Erskine Emery, MD 12/24/2022, 12:14 PM PGY-2, [place] {    This will disappear when note is signed, click to select method of visit    :1}

## 2022-12-25 ENCOUNTER — Ambulatory Visit (INDEPENDENT_AMBULATORY_CARE_PROVIDER_SITE_OTHER): Payer: Medicare HMO | Admitting: Student

## 2022-12-25 VITALS — BP 122/66 | HR 93 | Wt 208.8 lb

## 2022-12-25 DIAGNOSIS — J069 Acute upper respiratory infection, unspecified: Secondary | ICD-10-CM | POA: Diagnosis not present

## 2022-12-25 NOTE — Progress Notes (Signed)
    SUBJECTIVE:   CHIEF COMPLAINT / HPI:   Sick symptoms For past 5 days has had productive cough with yellow/green mucous, intermittent headache, sneezing, chills, body aches, sore throat. No fevers, eating and drinking okay. Admits to SOB and chest pain but this was happening before sickness and was recently evaluated in the ED and is currently following with cardiology.  Was diagnosed with GERD and started on Protonix and Carafate and had coronary CTA which revealed no significant stenosis.  Has been drinking warm tea w/ honey, takes Tylenol once daily.   PERTINENT  PMH / PSH: CAD, GERD  OBJECTIVE:   Vitals:   12/25/22 0839 12/25/22 0908  BP: 122/66   Pulse: (!) 107 93  SpO2: 96% 97%    General: NAD, pleasant, able to participate in exam HEENT: White sclera, clear conjunctiva, TMs pearly gray with cone of light present and nonbulging, erythematous and swollen nasal turbinates bilaterally, MMM, slight erythema of oropharynx with no exudate Cardiac: RRR, no murmurs. Respiratory: CTAB, normal effort, No wheezes, rales or rhonch Extremities: no edema of BLEs Skin: warm and dry Neuro: alert, no obvious focal deficits Psych: Normal affect and mood  ASSESSMENT/PLAN:   Viral upper respiratory tract infection Symptoms most consistent with viral URI.  Vital signs are stable with good SpO2 saturation, lung exam is reassuring.  Handout on URIs was provided and discussed with patient along with supportive care and return/ED precautions.      Dr. Precious Gilding, [place]

## 2022-12-25 NOTE — Patient Instructions (Signed)
It was great to see you! Thank you for allowing me to participate in your care!    Our plans for today:  - See the attached information on upper respiratory infections and what you can do at home - If your symptoms improve and then worsen significantly or you are having increased difficulty breathing please return, go to an urgent care or the ED for further evaluation  Take care and seek immediate care sooner if you develop any concerns.   Dr. Precious Gilding, DO [facility]

## 2022-12-25 NOTE — Assessment & Plan Note (Signed)
Symptoms most consistent with viral URI.  Vital signs are stable with good SpO2 saturation, lung exam is reassuring.  Handout on URIs was provided and discussed with patient along with supportive care and return/ED precautions.

## 2022-12-27 ENCOUNTER — Other Ambulatory Visit: Payer: Self-pay

## 2022-12-28 MED ORDER — SUCRALFATE 1 G PO TABS: 1.0000 g | ORAL_TABLET | Freq: Three times a day (TID) | ORAL | 2 refills | Status: DC

## 2023-02-20 NOTE — Progress Notes (Unsigned)
Office Visit    Patient Name: Matthew Wade. Date of Encounter: 02/20/2023  Primary Care Provider:  Fayette Pho, MD Primary Cardiologist:  None Primary Electrophysiologist: None   Past Medical History    Past Medical History:  Diagnosis Date   Asthma 06/15/2014   CAD (coronary artery disease), native coronary artery 10/2022   Coronary CTA showed a coronary calcium score of 36. moderate noncalcified plaque of 50 to 69%  stenosis o in the proximal first diagonal and minimal stenosis of 1-24% in the proximal LAD.  By FFR the diagonal is not hemodynamically significant   Hyperlipemia    Seizures (HCC)    per pt "granny seizure as child"   Past Surgical History:  Procedure Laterality Date   COLONOSCOPY WITH PROPOFOL N/A 02/08/2016   Procedure: COLONOSCOPY WITH PROPOFOL;  Surgeon: Scot Jun, MD;  Location: [facility] ENDOSCOPY;  Service: Endoscopy;  Laterality: N/A;   ESOPHAGOGASTRODUODENOSCOPY (EGD) WITH PROPOFOL N/A 02/08/2016   Procedure: ESOPHAGOGASTRODUODENOSCOPY (EGD) WITH PROPOFOL;  Surgeon: Scot Jun, MD;  Location: [facility] ENDOSCOPY;  Service: Endoscopy;  Laterality: N/A;   HAND SURGERY     Right "pinky" finger amputation    Allergies  Allergies  Allergen Reactions   Diphenhydramine     Swelling and hives     History of Present Illness    Duilio Heritage. is a 46 y.o. male with PMH of nonobstructive CAD, HLD, seizures, asthma, tobacco abuse who presents today for 55-month follow-up. Mr. Carattini was seen for initial consult by Dr. Mayford Knife on 11/13/2022 for complaint of chest pain. During his visit patient reported sharp pain with tightness to the left sternum that radiated into epigastrium x 3 weeks. He reported nausea with his pain. He reports exhaustion after the pain subsides that usually last 2 to 3 minutes. He has family history of CHF in his grandparents and AF in his father. EKG completed at his PCP was nonischemic and patient had several episodes  while in the office with Dr. Mayford Knife. He was referred to the ED for further evaluation and serial troponins with plan to complete CTA if troponin is normal. EKG in the ED was normal along with troponins. Coronary CTA was ordered and revealed no flow-limiting lesions and no significant stenosis and LAD, LCx, RCA, or left main. He had a calcium score of 36.9 and moderate noncalcified plaque in his first diagonal with FFR complete for further analysis that revealed no significant stenosis.   He was seen in follow-up on 11/27/2022 and reported occasional chest discomfort similar to reflux with shortness of breath relieved with squatting.  2D echo was completed 11/28/2022 that showed normal EF of 60 to 65% with grade 1 DD and no valvular abnormalities.  His blood pressure was well-controlled and results of cardiac CTA were reviewed and questions were answered.  Mr. Pinkerton presents today with his wife for 21-month follow-up.  Since last being seen in the office patient reports ongoing shortness of breath and also has reflux discomfort.  He has a family history of lung cancer and emphysema with additional family members suffering from COPD. He reports that shortness of breath is relieved with squatting and in the past he used a nebulizer for pneumonia. His blood pressure today is well-controlled at 126/86 and heart rate was 65 bpm.  He reports compliance with his current medications and denies any adverse reactions.  He abstains from ibuprofen due to previous adverse reactions with his reflux and is currently not active  due to his work schedule.  Patient denies chest pain, palpitations, dyspnea, PND, orthopnea, nausea, vomiting, dizziness, syncope, edema, weight gain, or early satiety.   Home Medications    Current Outpatient Medications  Medication Sig Dispense Refill   aspirin EC 81 MG tablet Take 81 mg by mouth in the morning and at bedtime. Swallow whole.     levocetirizine (XYZAL) 5 MG tablet Take by mouth.      pantoprazole (PROTONIX) 20 MG tablet Take 1 tablet (20 mg total) by mouth daily. 30 tablet 6   rosuvastatin (CRESTOR) 20 MG tablet TAKE 1 TABLET BY MOUTH ONCE DAILY. PLEASE  GO  TO  PCP  FOR  LAB  WORK 90 tablet 0   sucralfate (CARAFATE) 1 g tablet Take 1 tablet (1 g total) by mouth 4 (four) times daily -  with meals and at bedtime. 120 tablet 2   No current facility-administered medications for this visit.     Review of Systems  Please see the history of present illness.    (+) Shortness of breath (+) GI upset  All other systems reviewed and are otherwise negative except as noted above.  Physical Exam    Wt Readings from Last 3 Encounters:  12/25/22 208 lb 12.8 oz (94.7 kg)  11/27/22 207 lb (93.9 kg)  11/13/22 210 lb 9.6 oz (95.5 kg)   ZO:XWRUE were no vitals filed for this visit.,There is no height or weight on file to calculate BMI.  Constitutional:      Appearance: Healthy appearance. Not in distress.  Neck:     Vascular: JVD normal.  Pulmonary:     Effort: Pulmonary effort is normal.     Breath sounds: No wheezing. No rales. Diminished in the bases Cardiovascular:     Normal rate. Regular rhythm. Normal S1. Normal S2.      Murmurs: There is no murmur.  Edema:    Peripheral edema absent.  Abdominal:     Palpations: Abdomen is soft non tender. There is no hepatomegaly.  Skin:    General: Skin is warm and dry.  Neurological:     General: No focal deficit present.     Mental Status: Alert and oriented to person, place and time.     Cranial Nerves: Cranial nerves are intact.  EKG/LABS/ Recent Cardiac Studies    ECG personally reviewed by me today -none completed today  Cardiac Studies & Procedures       ECHOCARDIOGRAM  ECHOCARDIOGRAM COMPLETE 11/28/2022  Narrative ECHOCARDIOGRAM REPORT    Patient Name:   Aragorn Recker. Date of Exam: 11/28/2022 Medical Rec #:  [id number]           Height:       68.0 in Accession #:    [id number]          Weight:        207.0 lb Date of Birth:  August 08, 1976           BSA:          2.074 m Patient Age:    47 years            BP:           112/80 mmHg Patient Gender: M                   HR:           79 bpm. Exam Location:  [address]  Procedure: 2D Echo, 3D Echo, Cardiac Doppler, Color  Doppler and Strain Analysis  Indications:    R07.9 Chest Pain  History:        Patient has no prior history of Echocardiogram examinations. CAD, Signs/Symptoms:Chest Pain and Shortness of Breath; Risk Factors:Dyslipidemia and Former Smoker.  Sonographer:    Farrel Conners RDCS Referring Phys: Perlie Gold  IMPRESSIONS   1. Left ventricular ejection fraction, by estimation, is 60 to 65%. Left ventricular ejection fraction by 3D volume is 64 %. The left ventricle has normal function. The left ventricle has no regional wall motion abnormalities. Left ventricular diastolic parameters are consistent with Grade I diastolic dysfunction (impaired relaxation). The average left ventricular global longitudinal strain is -20.4 %. The global longitudinal strain is normal. 2. Right ventricular systolic function is normal. The right ventricular size is normal. 3. The mitral valve is normal in structure. No evidence of mitral valve regurgitation. No evidence of mitral stenosis. 4. The aortic valve is tricuspid. Aortic valve regurgitation is not visualized. No aortic stenosis is present. 5. The inferior vena cava is normal in size with greater than 50% respiratory variability, suggesting right atrial pressure of 3 mmHg.  FINDINGS Left Ventricle: Left ventricular ejection fraction, by estimation, is 60 to 65%. Left ventricular ejection fraction by 3D volume is 64 %. The left ventricle has normal function. The left ventricle has no regional wall motion abnormalities. The average left ventricular global longitudinal strain is -20.4 %. The global longitudinal strain is normal. The left ventricular internal cavity size was normal in size.  There is no left ventricular hypertrophy. Left ventricular diastolic parameters are consistent with Grade I diastolic dysfunction (impaired relaxation).  Right Ventricle: The right ventricular size is normal. No increase in right ventricular wall thickness. Right ventricular systolic function is normal.  Left Atrium: Left atrial size was normal in size.  Right Atrium: Right atrial size was normal in size.  Pericardium: There is no evidence of pericardial effusion.  Mitral Valve: The mitral valve is normal in structure. No evidence of mitral valve regurgitation. No evidence of mitral valve stenosis.  Tricuspid Valve: The tricuspid valve is normal in structure. Tricuspid valve regurgitation is not demonstrated. No evidence of tricuspid stenosis.  Aortic Valve: The aortic valve is tricuspid. Aortic valve regurgitation is not visualized. No aortic stenosis is present.  Pulmonic Valve: The pulmonic valve was normal in structure. Pulmonic valve regurgitation is trivial. No evidence of pulmonic stenosis.  Aorta: The aortic root is normal in size and structure.  Venous: The inferior vena cava is normal in size with greater than 50% respiratory variability, suggesting right atrial pressure of 3 mmHg.  IAS/Shunts: No atrial level shunt detected by color flow Doppler.   LEFT VENTRICLE PLAX 2D LVIDd:         4.30 cm         Diastology LVIDs:         2.70 cm         LV e' medial:    9.68 cm/s LV PW:         0.80 cm         LV E/e' medial:  5.4 LV IVS:        0.80 cm         LV e' lateral:   12.00 cm/s LVOT diam:     2.10 cm         LV E/e' lateral: 4.3 LV SV:         60 LV SV Index:   29  2D LVOT Area:     3.46 cm        Longitudinal Strain 2D Strain GLS  -20.0 % (A2C): 2D Strain GLS  -17.7 % (A3C): 2D Strain GLS  -23.4 % (A4C): 2D Strain GLS  -20.4 % Avg:  3D Volume EF LV 3D EF:    Left ventricul ar ejection fraction by 3D volume is 64 %.  3D Volume EF: 3D  EF:        64 % LV EDV:       113 ml LV ESV:       41 ml LV SV:        72 ml  RIGHT VENTRICLE RV Basal diam:  3.40 cm RV S prime:     12.60 cm/s TAPSE (M-mode): 1.7 cm  LEFT ATRIUM             Index        RIGHT ATRIUM           Index LA diam:        3.50 cm 1.69 cm/m   RA Area:     10.90 cm LA Vol (A2C):   25.8 ml 12.44 ml/m  RA Volume:   25.20 ml  12.15 ml/m LA Vol (A4C):   23.7 ml 11.43 ml/m LA Biplane Vol: 24.8 ml 11.96 ml/m AORTIC VALVE LVOT Vmax:   113.50 cm/s LVOT Vmean:  69.650 cm/s LVOT VTI:    0.173 m  AORTA Ao Root diam: 3.30 cm Ao Asc diam:  3.60 cm  MITRAL VALVE MV Area (PHT): cm         SHUNTS MV Decel Time: 190 msec    Systemic VTI:  0.17 m MV E velocity: 52.15 cm/s  Systemic Diam: 2.10 cm MV A velocity: 67.75 cm/s MV E/A ratio:  0.77  Donato Schultz MD Electronically signed by Donato Schultz MD Signature Date/Time: 11/28/2022/4:21:01 PM    Final     CT SCANS  CT CORONARY MORPH W/CTA COR W/SCORE 11/21/2022  Addendum 11/21/2022  7:39 AM ADDENDUM REPORT: 11/21/2022 07:37  EXAM: OVER-READ INTERPRETATION  CT CHEST  The following report is an over-read performed by radiologist Dr. Royal Piedra [facility] on 11/21/2022. This over-read does not include interpretation of cardiac or coronary anatomy or pathology. The coronary calcium score and cardiac CTA interpretation by the cardiologist is attached.  COMPARISON:  None.  FINDINGS: Within the visualized portions of the thorax there are no suspicious appearing pulmonary nodules or masses, there is no acute consolidative airspace disease, no pleural effusions, no pneumothorax and no lymphadenopathy. Visualized portions of the upper abdomen are unremarkable. There are no aggressive appearing lytic or blastic lesions noted in the visualized portions of the skeleton.  IMPRESSION: 1. No significant incidental noncardiac findings are noted.   Electronically Signed By: Trudie Reed M.D. On: 11/21/2022 07:37  Narrative CLINICAL DATA:  Chest pain  EXAM: Cardiac CTA  MEDICATIONS: Sub lingual nitro. 4mg  x 2  TECHNIQUE: The patient was scanned on a Siemens 192 slice scanner. Gantry rotation speed was 250 msecs. Collimation was 0.6 mm. A 100 kV prospective scan was triggered in the ascending thoracic aorta at 35-75% of the R-R interval. Average HR during the scan was 60 bpm. The 3D data set was interpreted on a dedicated work station using MPR, MIP and VRT modes. A total of 80cc of contrast was used.  FINDINGS: Non-cardiac: See separate report from [facility].  Pulmonary veins drain normally to the left atrium.  No LA appendage thrombus.  Calcium Score: 36.6 Agatston units.  Coronary Arteries: Right dominant with no anomalies  LM: No plaque or stenosis.  LAD system: Large 1st diagonal with noncalcified plaque proximally, stenosis appears moderate (50-69%). FFR 0.96 in the mixed D1, suspect proximal stenosis is not hemodynamically significant. Mixed plaque proximal LAD, mild (1-24%) stenosis.  Circumflex system: Relatively small vessel. No plaque or stenosis.  RCA system: No plaque or stenosis.  IMPRESSION: 1. Coronary artery calcium score 36.6 Agatston units. This places the patient in the 90th percentile for age and gender, suggesting high risk for future cardiac events.  2. Moderate stenosis with noncalcified plaque in proximal large D1. This is not hemodynamically significant by CT FFR.  [employer] account executive  Electronically Signed: By: Marca Ancona M.D. On: 11/19/2022 18:11          Risk Assessment/Calculations:            Lab Results  Component Value Date   WBC 5.5 11/13/2022   HGB 17.9 (H) 11/13/2022   HCT 50.4 11/13/2022   MCV 90.8 11/13/2022   PLT 220 11/13/2022   Lab Results  Component Value Date   CREATININE 0.95 11/13/2022   BUN 18 11/13/2022   NA 137 11/13/2022   K 4.0 11/13/2022   CL 107 11/13/2022    CO2 19 (L) 11/13/2022   Lab Results  Component Value Date   ALT 33 11/27/2022   AST 23 11/27/2022   ALKPHOS 92 11/27/2022   BILITOT 0.5 11/27/2022   Lab Results  Component Value Date   CHOL 143 11/27/2022   HDL 37 (L) 11/27/2022   LDLCALC 85 11/27/2022   TRIG 118 11/27/2022   CHOLHDL 3.9 11/27/2022    Lab Results  Component Value Date   HGBA1C 5.8 (H) 12/22/2021     Assessment & Plan    1.  Nonobstructive CAD: -s/p cardiac CTA completed showing no significant stenosis with calcium score of 36.6 which is 90th percentile for age, gender suggesting high risk for future cardiac event. -Continue primary prevention with ASA 81 mg and Crestor 20 mg -Patient reports ongoing shortness of breath  -He will complete exercise Myoview to rule out possible ischemia associated with shortness of breath. -Patient was advised to seek care in the ED if shortness of breath continues and is not resolved with rest.   2.  Mixed hyperlipidemia: -Patient will need updated LFTs and lipids -Continue Crestor 20 mg daily   3.  GERD: -Patient reports increased discomfort that occurs with lying down and following meals. -We will increase Protonix to 40 mg daily and patient was advised that if reflux continues we will refer to gastroenterology.   4.  Shortness of breath: -Patient reports ongoing shortness of breath that is worse with activity and requires squatting to relieve symptoms. -He notes shortness of breath with activities such as intercourse and walking short distances back-and-forth to his truck. -He denies any associated chest discomfort with shortness of breath. -Ambulatory referral to pulmonology -Albuterol inhaler as needed  Disposition: Follow-up with None or APP in 2 months Shared Decision Making/Informed Consent The risks [chest pain, shortness of breath, cardiac arrhythmias, dizziness, blood pressure fluctuations, myocardial infarction, stroke/transient ischemic attack, nausea,  vomiting, allergic reaction, radiation exposure, metallic taste sensation and life-threatening complications (estimated to be 1 in 10,000)], benefits (risk stratification, diagnosing coronary artery disease, treatment guidance) and alternatives of a nuclear stress test were discussed in detail with Mr. Miske and he agrees to proceed.   Medication  Adjustments/Labs and Tests Ordered: Current medicines are reviewed at length with the patient today.  Concerns regarding medicines are outlined above.   Signed, Napoleon Form, Leodis Rains, NP 02/20/2023, 7:20 PM [facility]

## 2023-02-21 ENCOUNTER — Ambulatory Visit: Payer: Medicare HMO | Attending: Nurse Practitioner | Admitting: Nurse Practitioner

## 2023-02-21 ENCOUNTER — Encounter: Payer: Self-pay | Admitting: Nurse Practitioner

## 2023-02-21 VITALS — BP 126/86 | HR 65 | Ht 68.0 in | Wt 210.0 lb

## 2023-02-21 DIAGNOSIS — R0602 Shortness of breath: Secondary | ICD-10-CM

## 2023-02-21 DIAGNOSIS — I2510 Atherosclerotic heart disease of native coronary artery without angina pectoris: Secondary | ICD-10-CM

## 2023-02-21 DIAGNOSIS — E782 Mixed hyperlipidemia: Secondary | ICD-10-CM | POA: Diagnosis not present

## 2023-02-21 DIAGNOSIS — R079 Chest pain, unspecified: Secondary | ICD-10-CM

## 2023-02-21 DIAGNOSIS — K219 Gastro-esophageal reflux disease without esophagitis: Secondary | ICD-10-CM

## 2023-02-21 MED ORDER — ALBUTEROL SULFATE HFA 108 (90 BASE) MCG/ACT IN AERS: 2.0000 | INHALATION_SPRAY | Freq: Four times a day (QID) | RESPIRATORY_TRACT | 0 refills | Status: DC | PRN

## 2023-02-21 MED ORDER — PANTOPRAZOLE SODIUM 40 MG PO TBEC: 40.0000 mg | DELAYED_RELEASE_TABLET | Freq: Every day | ORAL | 3 refills | Status: DC

## 2023-02-21 NOTE — Patient Instructions (Signed)
Medication Instructions:  INCREASE Protonix  Take tablet once a day  START Albuterol inhaler take as needed *If you need a refill on your cardiac medications before your next appointment, please call your pharmacy*   Lab Work: None ordered If you have labs (blood work) drawn today and your tests are completely normal, you will receive your results only by: MyChart Message (if you have MyChart) OR A paper copy in the mail If you have any lab test that is abnormal or we need to change your treatment, we will call you to review the results.   Testing/Procedures: Your physician has requested that you have an exercise stress myoview. For further information please visit [email or web address] Please follow instruction sheet, as given.    Follow-Up: At [facility], you and your health needs are our priority.  As part of our continuing mission to provide you with exceptional heart care, we have created designated Provider Care Teams.  These Care Teams include your primary Cardiologist (physician) and Advanced Practice Providers (APPs -  Physician Assistants and Nurse Practitioners) who all work together to provide you with the care you need, when you need it.  We recommend signing up for the patient portal called "MyChart".  Sign up information is provided on this After Visit Summary.  MyChart is used to connect with patients for Virtual Visits (Telemedicine).  Patients are able to view lab/test results, encounter notes, upcoming appointments, etc.  Non-urgent messages can be sent to your provider as well.   To learn more about what you can do with MyChart, go to [email or web address].    Your next appointment:   2 month(s)  Provider:   Robin Searing, NP   Other Instructions  You have been referred to Pulmonary

## 2023-02-28 ENCOUNTER — Telehealth (HOSPITAL_COMMUNITY): Payer: Self-pay | Admitting: *Deleted

## 2023-02-28 NOTE — Telephone Encounter (Signed)
Patient given detailed instructions per Myocardial Perfusion Study Information Sheet for the test on 03/04/2023 at 8:00. Patient notified to arrive 15 minutes early and that it is imperative to arrive on time for appointment to keep from having the test rescheduled.  If you need to cancel or reschedule your appointment, please call the office within 24 hours of your appointment. . Patient verbalized understanding.Matthew Wade

## 2023-03-04 ENCOUNTER — Ambulatory Visit (HOSPITAL_COMMUNITY): Payer: Medicare HMO | Attending: Internal Medicine

## 2023-03-04 DIAGNOSIS — K219 Gastro-esophageal reflux disease without esophagitis: Secondary | ICD-10-CM | POA: Diagnosis present

## 2023-03-04 DIAGNOSIS — R0602 Shortness of breath: Secondary | ICD-10-CM

## 2023-03-04 DIAGNOSIS — E782 Mixed hyperlipidemia: Secondary | ICD-10-CM | POA: Diagnosis present

## 2023-03-04 DIAGNOSIS — I2510 Atherosclerotic heart disease of native coronary artery without angina pectoris: Secondary | ICD-10-CM

## 2023-03-04 LAB — MYOCARDIAL PERFUSION IMAGING
Angina Index: 0
Duke Treadmill Score: 7
Estimated workload: 7.8
Exercise duration (min): 7 min
Exercise duration (sec): 1 s
LV dias vol: 88 mL (ref 62–150)
LV sys vol: 31 mL
MPHR: 175 {beats}/min
Nuc Stress EF: 65 %
Peak HR: 153 {beats}/min
Percent HR: 87 %
Rest HR: 56 {beats}/min
Rest Nuclear Isotope Dose: 10.3 mCi
SDS: 0
SRS: 0
SSS: 0
ST Depression (mm): 0 mm
Stress Nuclear Isotope Dose: 32.5 mCi
TID: 1.04

## 2023-03-04 MED ORDER — TECHNETIUM TC 99M TETROFOSMIN IV KIT
10.3000 | PACK | Freq: Once | INTRAVENOUS | Status: AC | PRN
  Administered 2023-03-04: 10.3 via INTRAVENOUS

## 2023-03-04 MED ORDER — TECHNETIUM TC 99M TETROFOSMIN IV KIT
32.5000 | PACK | Freq: Once | INTRAVENOUS | Status: AC | PRN
  Administered 2023-03-04: 32.5 via INTRAVENOUS

## 2023-03-15 ENCOUNTER — Encounter: Payer: Self-pay | Admitting: Pulmonary Disease

## 2023-03-15 ENCOUNTER — Ambulatory Visit (INDEPENDENT_AMBULATORY_CARE_PROVIDER_SITE_OTHER): Payer: Medicare HMO | Admitting: Pulmonary Disease

## 2023-03-15 VITALS — BP 126/74 | HR 73 | Wt 205.0 lb

## 2023-03-15 DIAGNOSIS — R0609 Other forms of dyspnea: Secondary | ICD-10-CM

## 2023-03-15 MED ORDER — BUDESONIDE-FORMOTEROL FUMARATE 160-4.5 MCG/ACT IN AERO: 2.0000 | INHALATION_SPRAY | Freq: Two times a day (BID) | RESPIRATORY_TRACT | 11 refills | Status: AC

## 2023-03-15 NOTE — Patient Instructions (Signed)
Nice to meet you  Use Symbicort 2 puffs in the morning and 2 puffs in the evening - rinse mouth after every use  Continue albuterol 2 puffs as needed  We will get PFT  I think based on symptoms and images I am concerned about asthma  Return to clinic in 3 months or sooner as needed

## 2023-03-15 NOTE — Progress Notes (Signed)
@Patient  ID: Matthew Wade., male    DOB: 03-28-1976, 47 y.o.   MRN: [id number]  Chief Complaint  Patient presents with   Consult    Referred by PCP for increased SOB that started back in January 2024. SOB increases with exertion. Tends to cough more in the mornings. Cough has been non-productive. Increased wheezing.     Referring provider: Gaston Islam., NP  HPI:   47 y.o. man whom we are seeing for evaluation of dyspnea on exertion.  Chief complaint shortness of breath and cough.  Most recent cardiology note x 2 reviewed.  Patient was in his usual state of health.  Developed onset of shortness of breath and daily cough.  Mainly in the morning.  January 2024.  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  Cough is largely dry but sometimes productive.  Worse in the morning.  Shortness of breath is throughout the day.  No time of day when things are better or worse.  No position to make things better or worse.  No seasonal or environmental factors he can identify to make things better or worse.  He has been prescribed albuterol rescue inhaler as needed.  He if this does provide relief albeit short acting.  Help with sensation of inability to get deep breath, shortness of breath.  Has used wife's steroid inhaler and this provided longer acting relief.  Uses this sparingly.  Not very often.  He had a cardiac workup includes a TTE that showed diastolic dysfunction.  CT coronary that showed some coronary artery disease, thickened bronchioles otherwise clear lungs on my review and interpretation.  Underwent nuclear stress test that showed no signs of ischemia, reassuring.  PMH: Hyperlipidemia Surgical history: Reviewed, he denies any Family history: Grandmother with emphysema, grandmother with CAD, grandmother with lung cancer Social history: Former smoker, 30+ pack year, quit 2022, lives in [place] /  Pulmonary Flowsheets:   ACT:      No data to display          MMRC:     No data to display          Epworth:      No data to display          Tests:   FENO:  No results found for: "NITRICOXIDE"  PFT:     No data to display          WALK:      No data to display          Imaging: MYOCARDIAL PERFUSION IMAGING  Result Date: 03/04/2023   The study is normal. The study is low risk.   No ST deviation was noted.   Left ventricular function is normal. Nuclear stress EF: 65 %. The left ventricular ejection fraction is normal (55-65%). End diastolic cavity size is normal. End systolic cavity size is normal.   Prior study not available for comparison. Negative stress test.  Fair functional mets.  Low risk study.    Lab Results:  CBC    Component Value Date/Time   WBC 5.5 11/13/2022 1035   RBC 5.55 11/13/2022 1035   HGB 17.9 (H) 11/13/2022 1035   HGB 17.7 01/06/2018 1616   HCT 50.4 11/13/2022 1035   HCT 51.9 (H) 01/06/2018 1616   PLT 220 11/13/2022 1035   PLT 229 01/06/2018 1616   MCV 90.8 11/13/2022 1035   MCV 93 01/06/2018 1616  MCV 91 05/25/2013 1042   MCH 32.3 11/13/2022 1035   MCHC 35.5 11/13/2022 1035   RDW 12.5 11/13/2022 1035   RDW 13.6 01/06/2018 1616   RDW 14.0 05/25/2013 1042   LYMPHSABS 2.4 01/06/2018 1616   MONOABS 0.8 09/27/2016 2100   EOSABS 0.3 01/06/2018 1616   BASOSABS 0.0 01/06/2018 1616    BMET    Component Value Date/Time   NA 137 11/13/2022 1035   NA 140 07/24/2018 1117   NA 137 05/25/2013 1042   K 4.0 11/13/2022 1035   K 3.9 05/25/2013 1042   CL 107 11/13/2022 1035   CL 107 05/25/2013 1042   CO2 19 (L) 11/13/2022 1035   CO2 25 05/25/2013 1042   GLUCOSE 95 11/13/2022 1035   GLUCOSE 89 05/25/2013 1042   BUN 18 11/13/2022 1035   BUN 24 07/24/2018 1117   BUN 15 05/25/2013 1042   CREATININE 0.95 11/13/2022 1035   CREATININE 1.00 05/25/2013 1042   CALCIUM 9.4 11/13/2022 1035   CALCIUM 8.7 05/25/2013 1042    GFRNONAA >60 11/13/2022 1035   GFRNONAA >60 05/25/2013 1042   GFRAA 91 07/24/2018 1117   GFRAA >60 05/25/2013 1042    BNP No results found for: "BNP"  ProBNP No results found for: "PROBNP"  Specialty Problems       Pulmonary Problems   Allergic rhinitis   Asthma   Viral upper respiratory tract infection    Allergies  Allergen Reactions   Diphenhydramine     Swelling and hives    Immunization History  Administered Date(s) Administered   Influenza,inj,Quad PF,6+ Mos 01/06/2018    Past Medical History:  Diagnosis Date   Asthma 06/15/2014   CAD (coronary artery disease), native coronary artery 10/2022   Coronary CTA showed a coronary calcium score of 36. moderate noncalcified plaque of 50 to 69%  stenosis o in the proximal first diagonal and minimal stenosis of 1-24% in the proximal LAD.  By FFR the diagonal is not hemodynamically significant   Hyperlipemia    Seizures (HCC)    per pt "granny seizure as child"    Tobacco History: Social History   Tobacco Use  Smoking Status Former   Packs/day: 1.00   Years: 30.00   Additional pack years: 0.00   Total pack years: 30.00   Types: Cigarettes   Quit date: 03/18/2021   Years since quitting: 1.9   Passive exposure: Past  Smokeless Tobacco Never   Counseling given: Not Answered   Continue to not smoke  Outpatient Encounter Medications as of 03/15/2023  Medication Sig   albuterol (VENTOLIN HFA) 108 (90 Base) MCG/ACT inhaler Inhale 2 puffs into the lungs every 6 (six) hours as needed for wheezing or shortness of breath.   aspirin EC 81 MG tablet Take 81 mg by mouth in the morning and at bedtime. Swallow whole.   budesonide-formoterol (SYMBICORT) 160-4.5 MCG/ACT inhaler Inhale 2 puffs into the lungs in the morning and at bedtime.   levocetirizine (XYZAL) 5 MG tablet Take by mouth.   pantoprazole (PROTONIX) 40 MG tablet Take 1 tablet (40 mg total) by mouth daily.   rosuvastatin (CRESTOR) 20 MG tablet TAKE 1 TABLET  BY MOUTH ONCE DAILY. PLEASE  GO  TO  PCP  FOR  LAB  WORK   sucralfate (CARAFATE) 1 g tablet Take 1 tablet (1 g total) by mouth 4 (four) times daily -  with meals and at bedtime.   No facility-administered encounter medications on file as of 03/15/2023.  Review of Systems  Review of Systems  No chest pain with exertion.  No orthopnea or PND.  Comprehensive review of systems otherwise negative. Physical Exam  BP 126/74   Pulse 73   Wt 205 lb (93 kg)   SpO2 97% Comment: on RA  BMI 31.17 kg/m   Wt Readings from Last 5 Encounters:  03/15/23 205 lb (93 kg)  03/04/23 210 lb (95.3 kg)  02/21/23 210 lb (95.3 kg)  12/25/22 208 lb 12.8 oz (94.7 kg)  11/27/22 207 lb (93.9 kg)    BMI Readings from Last 5 Encounters:  03/15/23 31.17 kg/m  03/04/23 31.93 kg/m  02/21/23 31.93 kg/m  12/25/22 31.75 kg/m  11/27/22 31.47 kg/m     Physical Exam General: Sitting in chair, no acute distress Eyes: EOMI, icterus Neck: Supple, JVP Pulmonary: Clear, no work of breathing Cardiovascular: Warm, no edema Abdomen: Nondistended, bowel sounds present MSK: No synovitis, no joint effusion Neuro: Normal gait, no weakness Psych: Normal mood, full affect    Assessment & Plan:   Dyspnea on exertion: Present now for several months starting January 2024.  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  High suspicion for asthma, eosinophil count of 300 noted in 2019.  Thickened bronchioles on CT 10/2022 mild hyperinflation 10/2022.  Possibly to smoking-related disease as well.  Start Symbicort high-dose 2 puff twice daily.  Continue albuterol as needed, this has been beneficial in the past and recently.  Reports improvement with inhaled steroids in the past as well.  PFTs for further evaluation.  Notably he has diastolic dysfunction, certainly this can contribute to dyspnea on exertion as well.  Tobacco abuse in remission:  Congratulated and encouraged ongoing abstinence.  PFTs to evaluate for smoking-related lung disease.   Return in about 3 months (around 06/15/2023) for f/u Dr. Judeth Horn, after PFT.   Karren Burly, MD 03/15/2023   This appointment required 61 minutes of patient care (this includes precharting, chart review, review of results, face-to-face care, etc.).

## 2023-03-22 ENCOUNTER — Other Ambulatory Visit: Payer: Self-pay | Admitting: Family Medicine

## 2023-03-22 DIAGNOSIS — E782 Mixed hyperlipidemia: Secondary | ICD-10-CM

## 2023-03-22 NOTE — Telephone Encounter (Signed)
Last lipid panel January.  Demonstrated LDL of 85.  Cardiac history includes CAD and chest pain, has seen cardiology.  No prior MI or stroke in chart.  No diagnosis of diabetes in chart.  Therefore, patient at goal of LDL <100 for primary prevention.  Will refill rosuvastatin 20 mg tablets.  If tighter control is needed in the future, could increase to max of 40 mg daily.  Fayette Pho, MD  The 10-year ASCVD risk score (Arnett DK, et al., 2019) is: 4.4%   Values used to calculate the score:     Age: 89 years     Sex: Male     Is Non-Hispanic African American: No     Diabetic: No     Tobacco smoker: Yes     Systolic Blood Pressure: 126 mmHg     Is BP treated: No     HDL Cholesterol: 37 mg/dL     Total Cholesterol: 143 mg/dL

## 2023-04-04 ENCOUNTER — Other Ambulatory Visit: Payer: Self-pay | Admitting: Family Medicine

## 2023-04-15 ENCOUNTER — Ambulatory Visit (INDEPENDENT_AMBULATORY_CARE_PROVIDER_SITE_OTHER): Payer: Medicare HMO

## 2023-04-15 VITALS — Ht 68.0 in | Wt 205.0 lb

## 2023-04-15 DIAGNOSIS — Z0000 Encounter for general adult medical examination without abnormal findings: Secondary | ICD-10-CM

## 2023-04-15 NOTE — Patient Instructions (Signed)
Mr. Matthew Wade , Thank you for taking time to come for your Medicare Wellness Visit. I appreciate your ongoing commitment to your health goals. Please review the following plan we discussed and let me know if I can assist you in the future.   These are the goals we discussed:  Goals      Increase physical activity        This is a list of the screening recommended for you and due dates:  Health Maintenance  Topic Date Due   DTaP/Tdap/Td vaccine (1 - Tdap) Never done   COVID-19 Vaccine (2 - 2023-24 season) 06/29/2022   Flu Shot  05/30/2023   Medicare Annual Wellness Visit  04/14/2024   Colon Cancer Screening  02/07/2026   Hepatitis C Screening  Completed   HIV Screening  Completed   HPV Vaccine  Aged Out    Advanced directives: Information on Advanced Care Planning can be found at [facility] Advance Health Care Directives Advance Health Care Directives ([email or web address] Please bring a copy of your health care power of attorney and living will to the office to be added to your chart at your convenience.  Conditions/risks identified: Aim for 30 minutes of exercise or brisk walking, 6-8 glasses of water, and 5 servings of fruits and vegetables each day.  Next appointment: Follow up in one year for your annual wellness visit   Preventive Care 40-64 Years, Male Preventive care refers to lifestyle choices and visits with your health care provider that can promote health and wellness. What does preventive care include? A yearly physical exam. This is also called an annual well check. Dental exams once or twice a year. Routine eye exams. Ask your health care provider how often you should have your eyes checked. Personal lifestyle choices, including: Daily care of your teeth and gums. Regular physical activity. Eating a healthy diet. Avoiding tobacco and drug use. Limiting alcohol use. Practicing safe sex. Taking low-dose aspirin every day starting at age 42. What happens  during an annual well check? The services and screenings done by your health care provider during your annual well check will depend on your age, overall health, lifestyle risk factors, and family history of disease. Counseling  Your health care provider may ask you questions about your: Alcohol use. Tobacco use. Drug use. Emotional well-being. Home and relationship well-being. Sexual activity. Eating habits. Work and work Astronomer. Screening  You may have the following tests or measurements: Height, weight, and BMI. Blood pressure. Lipid and cholesterol levels. These may be checked every 5 years, or more frequently if you are over 36 years old. Skin check. Lung cancer screening. You may have this screening every year starting at age 32 if you have a 30-pack-year history of smoking and currently smoke or have quit within the past 15 years. Fecal occult blood test (FOBT) of the stool. You may have this test every year starting at age 88. Flexible sigmoidoscopy or colonoscopy. You may have a sigmoidoscopy every 5 years or a colonoscopy every 10 years starting at age 62. Prostate cancer screening. Recommendations will vary depending on your family history and other risks. Hepatitis C blood test. Hepatitis B blood test. Sexually transmitted disease (STD) testing. Diabetes screening. This is done by checking your blood sugar (glucose) after you have not eaten for a while (fasting). You may have this done every 1-3 years. Discuss your test results, treatment options, and if necessary, the need for more tests with your health care provider.  Vaccines  Your health care provider may recommend certain vaccines, such as: Influenza vaccine. This is recommended every year. Tetanus, diphtheria, and acellular pertussis (Tdap, Td) vaccine. You may need a Td booster every 10 years. Zoster vaccine. You may need this after age 33. Pneumococcal 13-valent conjugate (PCV13) vaccine. You may need this if  you have certain conditions and have not been vaccinated. Pneumococcal polysaccharide (PPSV23) vaccine. You may need one or two doses if you smoke cigarettes or if you have certain conditions. Talk to your health care provider about which screenings and vaccines you need and how often you need them. This information is not intended to replace advice given to you by your health care provider. Make sure you discuss any questions you have with your health care provider. Document Released: 11/11/2015 Document Revised: 07/04/2016 Document Reviewed: 08/16/2015 Elsevier Interactive Patient Education  2017 [employer].  Fall Prevention in the Home Falls can cause injuries. They can happen to people of all ages. There are many things you can do to make your home safe and to help prevent falls. What can I do on the outside of my home? Regularly fix the edges of walkways and driveways and fix any cracks. Remove anything that might make you trip as you walk through a door, such as a raised step or threshold. Trim any bushes or trees on the path to your home. Use bright outdoor lighting. Clear any walking paths of anything that might make someone trip, such as rocks or tools. Regularly check to see if handrails are loose or broken. Make sure that both sides of any steps have handrails. Any raised decks and porches should have guardrails on the edges. Have any leaves, snow, or ice cleared regularly. Use sand or salt on walking paths during winter. Clean up any spills in your garage right away. This includes oil or grease spills. What can I do in the bathroom? Use night lights. Install grab bars by the toilet and in the tub and shower. Do not use towel bars as grab bars. Use non-skid mats or decals in the tub or shower. If you need to sit down in the shower, use a plastic, non-slip stool. Keep the floor dry. Clean up any water that spills on the floor as soon as it happens. Remove soap buildup in the  tub or shower regularly. Attach bath mats securely with double-sided non-slip rug tape. Do not have throw rugs and other things on the floor that can make you trip. What can I do in the bedroom? Use night lights. Make sure that you have a light by your bed that is easy to reach. Do not use any sheets or blankets that are too big for your bed. They should not hang down onto the floor. Have a firm chair that has side arms. You can use this for support while you get dressed. Do not have throw rugs and other things on the floor that can make you trip. What can I do in the kitchen? Clean up any spills right away. Avoid walking on wet floors. Keep items that you use a lot in easy-to-reach places. If you need to reach something above you, use a strong step stool that has a grab bar. Keep electrical cords out of the way. Do not use floor polish or wax that makes floors slippery. If you must use wax, use non-skid floor wax. Do not have throw rugs and other things on the floor that can make you trip. What  can I do with my stairs? Do not leave any items on the stairs. Make sure that there are handrails on both sides of the stairs and use them. Fix handrails that are broken or loose. Make sure that handrails are as long as the stairways. Check any carpeting to make sure that it is firmly attached to the stairs. Fix any carpet that is loose or worn. Avoid having throw rugs at the top or bottom of the stairs. If you do have throw rugs, attach them to the floor with carpet tape. Make sure that you have a light switch at the top of the stairs and the bottom of the stairs. If you do not have them, ask someone to add them for you. What else can I do to help prevent falls? Wear shoes that: Do not have high heels. Have rubber bottoms. Are comfortable and fit you well. Are closed at the toe. Do not wear sandals. If you use a stepladder: Make sure that it is fully opened. Do not climb a closed  stepladder. Make sure that both sides of the stepladder are locked into place. Ask someone to hold it for you, if possible. Clearly mark and make sure that you can see: Any grab bars or handrails. First and last steps. Where the edge of each step is. Use tools that help you move around (mobility aids) if they are needed. These include: Canes. Walkers. Scooters. Crutches. Turn on the lights when you go into a dark area. Replace any light bulbs as soon as they burn out. Set up your furniture so you have a clear path. Avoid moving your furniture around. If any of your floors are uneven, fix them. If there are any pets around you, be aware of where they are. Review your medicines with your doctor. Some medicines can make you feel dizzy. This can increase your chance of falling. Ask your doctor what other things that you can do to help prevent falls. This information is not intended to replace advice given to you by your health care provider. Make sure you discuss any questions you have with your health care provider. Document Released: 08/11/2009 Document Revised: 03/22/2016 Document Reviewed: 11/19/2014 Elsevier Interactive Patient Education  2017 [employer].

## 2023-04-15 NOTE — Progress Notes (Signed)
Subjective:   Matthew Furnas. is a 47 y.o. male who presents for an Initial Medicare Annual Wellness Visit.  I connected with  Adaline Sill. on 04/15/23 by a audio enabled telemedicine application and verified that I am speaking with the correct person using two identifiers.  Patient Location: Home  Provider Location: Home Office  I discussed the limitations of evaluation and management by telemedicine. The patient expressed understanding and agreed to proceed.  Review of Systems     Cardiac Risk Factors include: male gender;sedentary lifestyle     Objective:    Today's Vitals   04/15/23 1439  Weight: 205 lb (93 kg)  Height: 5\' 8"  (1.727 m)   Body mass index is 31.17 kg/m.     04/15/2023    2:42 PM 11/13/2022   10:14 AM 11/07/2022   10:07 AM 11/07/2022    8:48 AM 08/29/2022    9:56 AM 08/27/2022   11:57 AM 12/22/2021    3:21 PM  Advanced Directives  Does Patient Have a Medical Advance Directive? No No No No No No No  Would patient like information on creating a medical advance directive? Yes (MAU/Ambulatory/Procedural Areas - Information given) No - Patient declined  No - Patient declined No - Patient declined No - Patient declined No - Patient declined    Current Medications (verified) Outpatient Encounter Medications as of 04/15/2023  Medication Sig   albuterol (VENTOLIN HFA) 108 (90 Base) MCG/ACT inhaler Inhale 2 puffs into the lungs every 6 (six) hours as needed for wheezing or shortness of breath.   aspirin EC 81 MG tablet Take 81 mg by mouth in the morning and at bedtime. Swallow whole.   budesonide-formoterol (SYMBICORT) 160-4.5 MCG/ACT inhaler Inhale 2 puffs into the lungs in the morning and at bedtime.   levocetirizine (XYZAL) 5 MG tablet Take by mouth.   pantoprazole (PROTONIX) 40 MG tablet Take 1 tablet (40 mg total) by mouth daily.   rosuvastatin (CRESTOR) 20 MG tablet Take 1 tablet (20 mg total) by mouth daily.   sucralfate (CARAFATE) 1 g tablet  TAKE 1 TABLET BY MOUTH 4 TIMES DAILY WITH MEALS AND AT BEDTIME   No facility-administered encounter medications on file as of 04/15/2023.    Allergies (verified) Diphenhydramine   History: Past Medical History:  Diagnosis Date   Asthma 06/15/2014   CAD (coronary artery disease), native coronary artery 10/2022   Coronary CTA showed a coronary calcium score of 36. moderate noncalcified plaque of 50 to 69%  stenosis o in the proximal first diagonal and minimal stenosis of 1-24% in the proximal LAD.  By FFR the diagonal is not hemodynamically significant   Hyperlipemia    Seizures (HCC)    per pt "granny seizure as child"   Past Surgical History:  Procedure Laterality Date   COLONOSCOPY WITH PROPOFOL N/A 02/08/2016   Procedure: COLONOSCOPY WITH PROPOFOL;  Surgeon: Scot Jun, MD;  Location[facility] ENDOSCOPY;  Service: Endoscopy;  Laterality: N/A;   ESOPHAGOGASTRODUODENOSCOPY (EGD) WITH PROPOFOL N/A 02/08/2016   Procedure: ESOPHAGOGASTRODUODENOSCOPY (EGD) WITH PROPOFOL;  Surgeon: Scot Jun, MD;  Location: [facility] ENDOSCOPY;  Service: Endoscopy;  Laterality: N/A;   HAND SURGERY     Right "pinky" finger amputation   Family History  Problem Relation Age of Onset   Colon cancer Father 57   Atrial fibrillation Father    Social History   Socioeconomic History   Marital status: Married    Spouse name: Not on file  Number of children: Not on file   Years of education: Not on file   Highest education level: Not on file  Occupational History   Occupation: Disability    Comment: Legally blind (genetic)  Tobacco Use   Smoking status: Former    Packs/day: 1.00    Years: 30.00    Additional pack years: 0.00    Total pack years: 30.00    Types: Cigarettes    Quit date: 03/18/2021    Years since quitting: 2.0    Passive exposure: Past   Smokeless tobacco: Never  Substance and Sexual Activity   Alcohol use: Yes    Comment: socail   Drug use: Yes    Types: Marijuana     Comment: years ago   Sexual activity: Yes    Partners: Female  Other Topics Concern   Not on file  Social History Narrative   Not on file   Social Determinants of Health   Financial Resource Strain: Low Risk  (04/15/2023)   Overall Financial Resource Strain (CARDIA)    Difficulty of Paying Living Expenses: Not hard at all  Food Insecurity: No Food Insecurity (04/15/2023)   Hunger Vital Sign    Worried About Running Out of Food in the Last Year: Never true    Ran Out of Food in the Last Year: Never true  Transportation Needs: No Transportation Needs (04/15/2023)   PRAPARE - Administrator, Civil Service (Medical): No    Lack of Transportation (Non-Medical): No  Physical Activity: Insufficiently Active (04/15/2023)   Exercise Vital Sign    Days of Exercise per Week: 3 days    Minutes of Exercise per Session: 30 min  Stress: No Stress Concern Present (04/15/2023)   [employer] of Occupational Health - Occupational Stress Questionnaire    Feeling of Stress : Not at all  Social Connections: Moderately Integrated (04/15/2023)   Social Connection and Isolation Panel [NHANES]    Frequency of Communication with Friends and Family: More than three times a week    Frequency of Social Gatherings with Friends and Family: Three times a week    Attends Religious Services: More than 4 times per year    Active Member of Clubs or Organizations: No    Attends Banker Meetings: Never    Marital Status: Married    Tobacco Counseling Counseling given: Not Answered   Clinical Intake:  Pre-visit preparation completed: Yes  Pain : No/denies pain  Diabetes: No  How often do you need to have someone help you when you read instructions, pamphlets, or other written materials from your doctor or pharmacy?: 1 - Never  Diabetic?No   Interpreter Needed?: No  Information entered by :: Kandis Fantasia LPN   Activities of Daily Living    04/15/2023    2:42 PM  In your  present state of health, do you have any difficulty performing the following activities:  Hearing? 0  Vision? 0  Difficulty concentrating or making decisions? 0  Walking or climbing stairs? 0  Dressing or bathing? 0  Doing errands, shopping? 0  Preparing Food and eating ? N  Using the Toilet? N  In the past six months, have you accidently leaked urine? N  Do you have problems with loss of bowel control? N  Managing your Medications? N  Managing your Finances? N  Housekeeping or managing your Housekeeping? N    Patient Care Team: Fayette Pho, MD as PCP - General (Family Medicine) Quintella Reichert,  MD as Consulting Physician (Cardiology)  Indicate any recent Medical Services you may have received from other than [facility] providers in the past year (date may be approximate).     Assessment:   This is a routine wellness examination for Matthew Wade.  Hearing/Vision screen Hearing Screening - Comments:: Denies hearing difficulties   Vision Screening - Comments:: No vision problems; will schedule routine eye exam soon    Dietary issues and exercise activities discussed: Current Exercise Habits: Home exercise routine, Type of exercise: stretching;walking, Time (Minutes): 30, Frequency (Times/Week): 3, Weekly Exercise (Minutes/Week): 90, Intensity: Mild   Goals Addressed             This Visit's Progress    Increase physical activity        Depression Screen    04/15/2023    2:41 PM 12/25/2022    8:42 AM 11/07/2022    8:51 AM 08/29/2022    9:56 AM 08/27/2022   11:56 AM 12/22/2021    3:21 PM 11/07/2021   10:56 AM  PHQ 2/9 Scores  PHQ - 2 Score 0 4 6 0 1 0 2  PHQ- 9 Score  13 17 0 9 0 4    Fall Risk    04/15/2023    2:40 PM 08/27/2022   11:56 AM 03/09/2019    9:43 AM  Fall Risk   Falls in the past year? 0 0 0  Number falls in past yr: 0 0 0  Injury with Fall? 0 0   Risk for fall due to : No Fall Risks    Follow up Falls prevention discussed;Education provided;Falls  evaluation completed  Falls evaluation completed    FALL RISK PREVENTION PERTAINING TO THE HOME:  Any stairs in or around the home? No  If so, are there any without handrails? No  Home free of loose throw rugs in walkways, pet beds, electrical cords, etc? Yes  Adequate lighting in your home to reduce risk of falls? Yes   ASSISTIVE DEVICES UTILIZED TO PREVENT FALLS:  Life alert? No  Use of a cane, walker or w/c? No  Grab bars in the bathroom? Yes  Shower chair or bench in shower? No  Elevated toilet seat or a handicapped toilet? Yes   TIMED UP AND GO:  Was the test performed? No . Telephonic visit   Cognitive Function:        04/15/2023    2:43 PM  6CIT Screen  What Year? 0 points  What month? 0 points  What time? 0 points  Count back from 20 0 points  Months in reverse 0 points  Repeat phrase 0 points  Total Score 0 points    Immunizations Immunization History  Administered Date(s) Administered   Influenza,inj,Quad PF,6+ Mos 01/06/2018    TDAP status: Due, Education has been provided regarding the importance of this vaccine. Advised may receive this vaccine at local pharmacy or Health Dept. Aware to provide a copy of the vaccination record if obtained from local pharmacy or Health Dept. Verbalized acceptance and understanding.  Pneumococcal vaccine status: Up to date  Covid-19 vaccine status: Information provided on how to obtain vaccines.   Qualifies for Shingles Vaccine? No    Screening Tests Health Maintenance  Topic Date Due   DTaP/Tdap/Td (1 - Tdap) Never done   COVID-19 Vaccine (2 - 2023-24 season) 06/29/2022   INFLUENZA VACCINE  05/30/2023   Medicare Annual Wellness (AWV)  04/14/2024   Colonoscopy  02/07/2026   Hepatitis C Screening  Completed  HIV Screening  Completed   HPV VACCINES  Aged Out    Health Maintenance  Health Maintenance Due  Topic Date Due   DTaP/Tdap/Td (1 - Tdap) Never done   COVID-19 Vaccine (2 - 2023-24 season) 06/29/2022     Colorectal cancer screening: Type of screening: Colonoscopy. Completed 02/08/16. Repeat every 10 years  Lung Cancer Screening: (Low Dose CT Chest recommended if Age 68-80 years, 30 pack-year currently smoking OR have quit w/in 15years.) does not qualify.   Lung Cancer Screening Referral: n/a  Additional Screening:  Hepatitis C Screening: does qualify; Completed 05/27/20  Vision Screening: Recommended annual ophthalmology exams for early detection of glaucoma and other disorders of the eye. Is the patient up to date with their annual eye exam?  No  Who is the provider or what is the name of the office in which the patient attends annual eye exams? none If pt is not established with a provider, would they like to be referred to a provider to establish care? No .   Dental Screening: Recommended annual dental exams for proper oral hygiene  Community Resource Referral / Chronic Care Management: CRR required this visit?  No   CCM required this visit?  No      Plan:     I have personally reviewed and noted the following in the patient's chart:   Medical and social history Use of alcohol, tobacco or illicit drugs  Current medications and supplements including opioid prescriptions. Patient is not currently taking opioid prescriptions. Functional ability and status Nutritional status Physical activity Advanced directives List of other physicians Hospitalizations, surgeries, and ER visits in previous 12 months Vitals Screenings to include cognitive, depression, and falls Referrals and appointments  In addition, I have reviewed and discussed with patient certain preventive protocols, quality metrics, and best practice recommendations. A written personalized care plan for preventive services as well as general preventive health recommendations were provided to patient.     Durwin Nora, [place]   06/26/5620   Due to this being a virtual visit, the after visit summary  with patients personalized plan was offered to patient via mail or my-chart. Patient would like to access on my-chart  Nurse Notes: No concerns

## 2023-04-30 NOTE — Progress Notes (Unsigned)
Office Visit    Patient Name: Matthew Wade. Date of Encounter: 04/30/2023  Primary Care Provider:  Cyndia Skeeters, DO Primary Cardiologist:  None Primary Electrophysiologist: None   Past Medical History    Past Medical History:  Diagnosis Date   Asthma 06/15/2014   CAD (coronary artery disease), native coronary artery 10/2022   Coronary CTA showed a coronary calcium score of 36. moderate noncalcified plaque of 50 to 69%  stenosis o in the proximal first diagonal and minimal stenosis of 1-24% in the proximal LAD.  By FFR the diagonal is not hemodynamically significant   Hyperlipemia    Seizures (HCC)    per pt "granny seizure as child"   Past Surgical History:  Procedure Laterality Date   COLONOSCOPY WITH PROPOFOL N/A 02/08/2016   Procedure: COLONOSCOPY WITH PROPOFOL;  Surgeon: Scot Jun, MD;  Location: [facility] ENDOSCOPY;  Service: Endoscopy;  Laterality: N/A;   ESOPHAGOGASTRODUODENOSCOPY (EGD) WITH PROPOFOL N/A 02/08/2016   Procedure: ESOPHAGOGASTRODUODENOSCOPY (EGD) WITH PROPOFOL;  Surgeon: Scot Jun, MD;  Location: [facility] ENDOSCOPY;  Service: Endoscopy;  Laterality: N/A;   HAND SURGERY     Right "pinky" finger amputation    Allergies  Allergies  Allergen Reactions   Diphenhydramine     Swelling and hives     History of Present Illness    Matthew Wade. is a 47 y.o. male with PMH of nonobstructive CAD, HLD, seizures, asthma, tobacco abuse who presents today for 28-month follow-up. Matthew Wade was seen for initial consult by Dr. Mayford Knife on 11/13/2022 for complaint of chest pain. He has family history of CHF in his grandparents and AF in his father. EKG in the ED was normal along with troponins. Coronary CTA was ordered and revealed no flow-limiting lesions and no significant stenosis and LAD, LCx, RCA, or left main. He had a calcium score of 36.9 and moderate noncalcified plaque in his first diagonal with FFR complete for further analysis that revealed no  significant stenosis. 2D echo was completed 11/28/2022 that showed normal EF of 60 to 65% with grade 1 DD and no valvular abnormalities.  He was last seen on 02/21/2023 for a follow-up visit. Blood pressures were controlled and patient reported ongoing shortness of breath and underwent Lexiscan Myoview that was normal.  He was referred to pulmonology and provided albuterol inhaler.  Matthew Wade presents today for 100-month follow-up alone.  Since last being seen in the office patient reports that he has been doing well from a cardiac perspective but does still endorse episodes of chest discomfort that occur when he gets upset.  He reports feeling isolation regarding his current diet and feels sometimes like he is not able to eat foods similar to his family.  He was visibly upset and began to breakdown emotionally during her visit.  He is currently struggling with some depression and during today's visit mentioned episodes of suicidal thoughts that have been occurring over the past few months.  He is currently dealing with financial issues and situationally eviction from his current residence.  He reports not having a current plan for suicide and by the end of our visit was no longer having and denied suicidal ideation.  He was given the suicide hotline [phone] and was also provided resources to care for [facility] behavioral health urgent care.  He was thankful for this information and reported considering contacting them later today.  His blood pressure today was controlled at 116/76 and heart rate was 87  bpm.  Patient denies chest pain, palpitations, dyspnea, PND, orthopnea, nausea, vomiting, dizziness, syncope, edema, weight gain, or early satiety.   Home Medications    Current Outpatient Medications  Medication Sig Dispense Refill   albuterol (VENTOLIN HFA) 108 (90 Base) MCG/ACT inhaler Inhale 2 puffs into the lungs every 6 (six) hours as needed for wheezing or shortness of breath. 8 g 0   aspirin EC 81 MG  tablet Take 81 mg by mouth in the morning and at bedtime. Swallow whole.     budesonide-formoterol (SYMBICORT) 160-4.5 MCG/ACT inhaler Inhale 2 puffs into the lungs in the morning and at bedtime. 1 each 11   levocetirizine (XYZAL) 5 MG tablet Take by mouth.     pantoprazole (PROTONIX) 40 MG tablet Take 1 tablet (40 mg total) by mouth daily. 30 tablet 3   rosuvastatin (CRESTOR) 20 MG tablet Take 1 tablet (20 mg total) by mouth daily. 90 tablet 3   sucralfate (CARAFATE) 1 g tablet TAKE 1 TABLET BY MOUTH 4 TIMES DAILY WITH MEALS AND AT BEDTIME 120 tablet 3   No current facility-administered medications for this visit.     Review of Systems  Please see the history of present illness.    (+) Depression (+) Reflux chest pain  All other systems reviewed and are otherwise negative except as noted above.  Physical Exam    Wt Readings from Last 3 Encounters:  04/15/23 205 lb (93 kg)  03/15/23 205 lb (93 kg)  03/04/23 210 lb (95.3 kg)   NF:AOZHY were no vitals filed for this visit.,There is no height or weight on file to calculate BMI.  Constitutional:      Appearance: Healthy appearance. Not in distress.  Neck:     Vascular: JVD normal.  Pulmonary:     Effort: Pulmonary effort is normal.     Breath sounds: No wheezing. No rales. Diminished in the bases Cardiovascular:     Normal rate. Regular rhythm. Normal S1. Normal S2.      Murmurs: There is no murmur.  Edema:    Peripheral edema absent.  Abdominal:     Palpations: Abdomen is soft non tender. There is no hepatomegaly.  Skin:    General: Skin is warm and dry.  Neurological:     General: No focal deficit present.  Depression and suicidal ideations    Mental Status: Alert and oriented to person, place and time.     Cranial Nerves: Cranial nerves are intact.  EKG/LABS/ Recent Cardiac Studies    ECG personally reviewed by me today -none completed today  Cardiac Studies & Procedures     STRESS TESTS  MYOCARDIAL PERFUSION  IMAGING 03/04/2023  Narrative   The study is normal. The study is low risk.   No ST deviation was noted.   Left ventricular function is normal. Nuclear stress EF: 65 %. The left ventricular ejection fraction is normal (55-65%). End diastolic cavity size is normal. End systolic cavity size is normal.   Prior study not available for comparison.  Negative stress test.  Fair functional mets.  Low risk study.   ECHOCARDIOGRAM  ECHOCARDIOGRAM COMPLETE 11/28/2022  Narrative ECHOCARDIOGRAM REPORT    Patient Name:   Halen Kilby. Date of Exam: 11/28/2022 Medical Rec #:  [id number]           Height:       68.0 in Accession #:    [id number]          Weight:  207.0 lb Date of Birth:  [birth year]           BSA:          2.074 m Patient Age:    47 years            BP:           112/80 mmHg Patient Gender: M                   HR:           79 bpm. Exam Location:  [address]  Procedure: 2D Echo, 3D Echo, Cardiac Doppler, Color Doppler and Strain Analysis  Indications:    R07.9 Chest Pain  History:        Patient has no prior history of Echocardiogram examinations. CAD, Signs/Symptoms:Chest Pain and Shortness of Breath; Risk Factors:Dyslipidemia and Former Smoker.  Sonographer:    Farrel Conners RDCS Referring Phys: Perlie Gold  IMPRESSIONS   1. Left ventricular ejection fraction, by estimation, is 60 to 65%. Left ventricular ejection fraction by 3D volume is 64 %. The left ventricle has normal function. The left ventricle has no regional wall motion abnormalities. Left ventricular diastolic parameters are consistent with Grade I diastolic dysfunction (impaired relaxation). The average left ventricular global longitudinal strain is -20.4 %. The global longitudinal strain is normal. 2. Right ventricular systolic function is normal. The right ventricular size is normal. 3. The mitral valve is normal in structure. No evidence of mitral valve regurgitation. No evidence of mitral  stenosis. 4. The aortic valve is tricuspid. Aortic valve regurgitation is not visualized. No aortic stenosis is present. 5. The inferior vena cava is normal in size with greater than 50% respiratory variability, suggesting right atrial pressure of 3 mmHg.  FINDINGS Left Ventricle: Left ventricular ejection fraction, by estimation, is 60 to 65%. Left ventricular ejection fraction by 3D volume is 64 %. The left ventricle has normal function. The left ventricle has no regional wall motion abnormalities. The average left ventricular global longitudinal strain is -20.4 %. The global longitudinal strain is normal. The left ventricular internal cavity size was normal in size. There is no left ventricular hypertrophy. Left ventricular diastolic parameters are consistent with Grade I diastolic dysfunction (impaired relaxation).  Right Ventricle: The right ventricular size is normal. No increase in right ventricular wall thickness. Right ventricular systolic function is normal.  Left Atrium: Left atrial size was normal in size.  Right Atrium: Right atrial size was normal in size.  Pericardium: There is no evidence of pericardial effusion.  Mitral Valve: The mitral valve is normal in structure. No evidence of mitral valve regurgitation. No evidence of mitral valve stenosis.  Tricuspid Valve: The tricuspid valve is normal in structure. Tricuspid valve regurgitation is not demonstrated. No evidence of tricuspid stenosis.  Aortic Valve: The aortic valve is tricuspid. Aortic valve regurgitation is not visualized. No aortic stenosis is present.  Pulmonic Valve: The pulmonic valve was normal in structure. Pulmonic valve regurgitation is trivial. No evidence of pulmonic stenosis.  Aorta: The aortic root is normal in size and structure.  Venous: The inferior vena cava is normal in size with greater than 50% respiratory variability, suggesting right atrial pressure of 3 mmHg.  IAS/Shunts: No atrial level  shunt detected by color flow Doppler.   LEFT VENTRICLE PLAX 2D LVIDd:         4.30 cm         Diastology LVIDs:  2.70 cm         LV e' medial:    9.68 cm/s LV PW:         0.80 cm         LV E/e' medial:  5.4 LV IVS:        0.80 cm         LV e' lateral:   12.00 cm/s LVOT diam:     2.10 cm         LV E/e' lateral: 4.3 LV SV:         60 LV SV Index:   29              2D LVOT Area:     3.46 cm        Longitudinal Strain 2D Strain GLS  -20.0 % (A2C): 2D Strain GLS  -17.7 % (A3C): 2D Strain GLS  -23.4 % (A4C): 2D Strain GLS  -20.4 % Avg:  3D Volume EF LV 3D EF:    Left ventricul ar ejection fraction by 3D volume is 64 %.  3D Volume EF: 3D EF:        64 % LV EDV:       113 ml LV ESV:       41 ml LV SV:        72 ml  RIGHT VENTRICLE RV Basal diam:  3.40 cm RV S prime:     12.60 cm/s TAPSE (M-mode): 1.7 cm  LEFT ATRIUM             Index        RIGHT ATRIUM           Index LA diam:        3.50 cm 1.69 cm/m   RA Area:     10.90 cm LA Vol (A2C):   25.8 ml 12.44 ml/m  RA Volume:   25.20 ml  12.15 ml/m LA Vol (A4C):   23.7 ml 11.43 ml/m LA Biplane Vol: 24.8 ml 11.96 ml/m AORTIC VALVE LVOT Vmax:   113.50 cm/s LVOT Vmean:  69.650 cm/s LVOT VTI:    0.173 m  AORTA Ao Root diam: 3.30 cm Ao Asc diam:  3.60 cm  MITRAL VALVE MV Area (PHT): cm         SHUNTS MV Decel Time: 190 msec    Systemic VTI:  0.17 m MV E velocity: 52.15 cm/s  Systemic Diam: 2.10 cm MV A velocity: 67.75 cm/s MV E/A ratio:  0.77  Donato Schultz MD Electronically signed by Donato Schultz MD Signature Date/Time: 11/28/2022/4:21:01 PM    Final     CT SCANS  CT CORONARY MORPH W/CTA COR W/SCORE 11/21/2022  Addendum 11/21/2022  7:39 AM ADDENDUM REPORT: 11/21/2022 07:37  EXAM: OVER-READ INTERPRETATION  CT CHEST  The following report is an over-read performed by radiologist Dr. Royal Piedra [facility] on 11/21/2022. This over-read does not include interpretation of  cardiac or coronary anatomy or pathology. The coronary calcium score and cardiac CTA interpretation by the cardiologist is attached.  COMPARISON:  None.  FINDINGS: Within the visualized portions of the thorax there are no suspicious appearing pulmonary nodules or masses, there is no acute consolidative airspace disease, no pleural effusions, no pneumothorax and no lymphadenopathy. Visualized portions of the upper abdomen are unremarkable. There are no aggressive appearing lytic or blastic lesions noted in the visualized portions of the skeleton.  IMPRESSION: 1. No significant incidental noncardiac findings are noted.   Electronically Signed  By: Trudie Reed M.D. On: 11/21/2022 07:37  Narrative CLINICAL DATA:  Chest pain  EXAM: Cardiac CTA  MEDICATIONS: Sub lingual nitro. 4mg  x 2  TECHNIQUE: The patient was scanned on a Siemens 192 slice scanner. Gantry rotation speed was 250 msecs. Collimation was 0.6 mm. A 100 kV prospective scan was triggered in the ascending thoracic aorta at 35-75% of the R-R interval. Average HR during the scan was 60 bpm. The 3D data set was interpreted on a dedicated work station using MPR, MIP and VRT modes. A total of 80cc of contrast was used.  FINDINGS: Non-cardiac: See separate report from [facility].  Pulmonary veins drain normally to the left atrium. No LA appendage thrombus.  Calcium Score: 36.6 Agatston units.  Coronary Arteries: Right dominant with no anomalies  LM: No plaque or stenosis.  LAD system: Large 1st diagonal with noncalcified plaque proximally, stenosis appears moderate (50-69%). FFR 0.96 in the mixed D1, suspect proximal stenosis is not hemodynamically significant. Mixed plaque proximal LAD, mild (1-24%) stenosis.  Circumflex system: Relatively small vessel. No plaque or stenosis.  RCA system: No plaque or stenosis.  IMPRESSION: 1. Coronary artery calcium score 36.6 Agatston units. This  places the patient in the 90th percentile for age and gender, suggesting high risk for future cardiac events.  2. Moderate stenosis with noncalcified plaque in proximal large D1. This is not hemodynamically significant by CT FFR.  [employer] account executive  Electronically Signed: By: Marca Ancona M.D. On: 11/19/2022 18:11         Lab Results  Component Value Date   WBC 5.5 11/13/2022   HGB 17.9 (H) 11/13/2022   HCT 50.4 11/13/2022   MCV 90.8 11/13/2022   PLT 220 11/13/2022   Lab Results  Component Value Date   CREATININE 0.95 11/13/2022   BUN 18 11/13/2022   NA 137 11/13/2022   K 4.0 11/13/2022   CL 107 11/13/2022   CO2 19 (L) 11/13/2022   Lab Results  Component Value Date   ALT 33 11/27/2022   AST 23 11/27/2022   ALKPHOS 92 11/27/2022   BILITOT 0.5 11/27/2022   Lab Results  Component Value Date   CHOL 143 11/27/2022   HDL 37 (L) 11/27/2022   LDLCALC 85 11/27/2022   TRIG 118 11/27/2022   CHOLHDL 3.9 11/27/2022    Lab Results  Component Value Date   HGBA1C 5.8 (H) 12/22/2021   Assessment & Plan    1.Nonobstructive CAD: -s/p cardiac CTA completed showing no significant stenosis with calcium score of 36.6 and recently completed an exercise Myoview that was normal. -Today patient endorses some chest discomfort that occurs with emotional upset but is not related to any physical activity.  2. Shortness of breath:  -Patient reports shortness of breath has resolved but does occur with heavy exertion. -He was advised to continue his current treatment plan with Symbicort  3.  GERD: -Patient reports ongoing chest discomfort related to meals and reflux. -Ambulatory referral to GI  -Continue Protonix 40 mg  4.  Mixed HLD: -Patient's last LDL cholesterol was 85 -Continue Crestor 20 mg daily  5.  Depression and anxiety: -Patient reports situational depression related to his current financial state and endorsed some suicidal ideations since previous visit.  He reports not  having a current plan for suicide and before leaving today received the 988 suicide prevention number and was no longer having ideations by the end of the visit. -He was provided information for our life coach for assistance with  additional resources  I also instructed him to follow-up with his PCP for further guidance and assistance with therapeutic options.  Disposition: Follow-up with None or APP in 3 months    Medication Adjustments/Labs and Tests Ordered: Current medicines are reviewed at length with the patient today.  Concerns regarding medicines are outlined above.   Signed, Napoleon Form, Leodis Rains, NP 04/30/2023, 8:21 AM [facility]

## 2023-05-01 ENCOUNTER — Encounter: Payer: Self-pay | Admitting: Nurse Practitioner

## 2023-05-01 ENCOUNTER — Ambulatory Visit: Payer: Medicare HMO | Attending: Nurse Practitioner | Admitting: Nurse Practitioner

## 2023-05-01 VITALS — BP 116/76 | HR 81 | Ht 68.0 in | Wt 207.2 lb

## 2023-05-01 DIAGNOSIS — K219 Gastro-esophageal reflux disease without esophagitis: Secondary | ICD-10-CM

## 2023-05-01 DIAGNOSIS — F419 Anxiety disorder, unspecified: Secondary | ICD-10-CM

## 2023-05-01 DIAGNOSIS — R0602 Shortness of breath: Secondary | ICD-10-CM

## 2023-05-01 DIAGNOSIS — E782 Mixed hyperlipidemia: Secondary | ICD-10-CM

## 2023-05-01 DIAGNOSIS — F32A Depression, unspecified: Secondary | ICD-10-CM

## 2023-05-01 DIAGNOSIS — R45851 Suicidal ideations: Secondary | ICD-10-CM

## 2023-05-01 DIAGNOSIS — I2510 Atherosclerotic heart disease of native coronary artery without angina pectoris: Secondary | ICD-10-CM | POA: Diagnosis not present

## 2023-05-01 NOTE — Patient Instructions (Addendum)
Medication Instructions:  Your physician recommends that you continue on your current medications as directed. Please refer to the Current Medication list given to you today. *If you need a refill on your cardiac medications before your next appointment, please call your pharmacy*   Lab Work: None ordered   Testing/Procedures: None ordered   Follow-Up: At [facility], you and your health needs are our priority.  As part of our continuing mission to provide you with exceptional heart care, we have created designated Provider Care Teams.  These Care Teams include your primary Cardiologist (physician) and Advanced Practice Providers (APPs -  Physician Assistants and Nurse Practitioners) who all work together to provide you with the care you need, when you need it.  We recommend signing up for the patient portal called "MyChart".  Sign up information is provided on this After Visit Summary.  MyChart is used to connect with patients for Virtual Visits (Telemedicine).  Patients are able to view lab/test results, encounter notes, upcoming appointments, etc.  Non-urgent messages can be sent to your provider as well.   To learn more about what you can do with MyChart, go to [email or web address].    Your next appointment:   3 month(s)  Provider:   Armanda Magic, MD or Robin Searing, NP  Other Instructions   You have been referred to GASTROENTEROLOGY  24/7 cone help line [phone]  [facility] Phone:n [phone] [address] [place], [place] [address]  National Suicide prevention lifeline  1800-273-TALK  The National Suicide Prevention Lifeline (570) 111-8123

## 2023-06-09 ENCOUNTER — Other Ambulatory Visit: Payer: Self-pay | Admitting: Nurse Practitioner

## 2023-06-16 ENCOUNTER — Other Ambulatory Visit: Payer: Self-pay | Admitting: Nurse Practitioner

## 2023-07-15 ENCOUNTER — Other Ambulatory Visit: Payer: Self-pay | Admitting: Family Medicine

## 2023-08-11 ENCOUNTER — Other Ambulatory Visit: Payer: Self-pay | Admitting: Family Medicine

## 2023-08-12 ENCOUNTER — Ambulatory Visit: Payer: Medicare HMO | Admitting: Family Medicine

## 2023-08-12 ENCOUNTER — Encounter: Payer: Self-pay | Admitting: Family Medicine

## 2023-08-12 VITALS — BP 120/87 | HR 85 | Ht 68.0 in | Wt 211.6 lb

## 2023-08-12 DIAGNOSIS — K219 Gastro-esophageal reflux disease without esophagitis: Secondary | ICD-10-CM | POA: Diagnosis not present

## 2023-08-12 DIAGNOSIS — S0101XD Laceration without foreign body of scalp, subsequent encounter: Secondary | ICD-10-CM | POA: Diagnosis not present

## 2023-08-12 NOTE — Progress Notes (Addendum)
    SUBJECTIVE:   CHIEF COMPLAINT / HPI: head lac  Went to urgent care for 4 days ago after injury to back of his head.  Staples were placed for head laceration.  Pain is stable, but not improving yet. No fevers or drainage. Looking for pain control. Unable to take ibuprofen.   PERTINENT  PMH / PSH: GERD, CAD, Hx of Kidney Stones  OBJECTIVE:   BP 120/87   Pulse 85   Ht 5\' 8"  (1.727 m)   Wt 211 lb 9.6 oz (96 kg)   SpO2 97%   BMI 32.17 kg/m   General: NAD, well appearing Neuro: A&O Respiratory: normal WOB on RA Extremities: Moving all 4 extremities equally Head: Well-healing 4 cm scalp laceration, no erythema, no purulent drainage, staples present   ASSESSMENT/PLAN:   Assessment & Plan Laceration of scalp without foreign body, subsequent encounter Exam consistent with well-healing laceration.  No evidence of infection.  Staples remain in for 6 more days.  Instructed to make an appointment for staple removal in 1 week.  Patient agreeable to plan.  1000 mg Tylenol up to 4 times a day as needed for pain.  Patient should avoid NSAIDs due to history of GERD and nephrolithiasis.  Received Tdap vaccine at urgent care. Gastroesophageal reflux disease without esophagitis Denies needing refill of Carafate.  Return in about 1 week (around 08/19/2023).  Celine Mans, MD [facility]

## 2023-08-12 NOTE — Assessment & Plan Note (Signed)
Denies needing refill of Carafate.

## 2023-08-12 NOTE — Patient Instructions (Signed)
It was great to see you! Thank you for allowing me to participate in your care!  Our plans for today:  - Your scalp wound looks like it is healing well! - You may use bacitracin ointment as needed. - Please make an appointment to schedule an appointment in 6 days to remove your staples. - You may take Tylenol 1000mg  up to 4 times a day as needed for pain.   Please arrive 15 minutes PRIOR to your next scheduled appointment time! If you do not, this affects OTHER patients' care.  Take care and seek immediate care sooner if you develop any concerns.   Celine Mans, MD, PGY-2 [facility] 2:20 PM 08/12/2023  [facility]

## 2023-08-20 ENCOUNTER — Ambulatory Visit (INDEPENDENT_AMBULATORY_CARE_PROVIDER_SITE_OTHER): Payer: Medicare HMO | Admitting: Student

## 2023-08-20 ENCOUNTER — Encounter: Payer: Self-pay | Admitting: Student

## 2023-08-20 VITALS — BP 135/72 | HR 95 | Ht 68.0 in | Wt 211.8 lb

## 2023-08-20 DIAGNOSIS — S0101XD Laceration without foreign body of scalp, subsequent encounter: Secondary | ICD-10-CM

## 2023-08-20 NOTE — Patient Instructions (Signed)
It was wonderful to meet you today. Thank you for allowing me to be a part of your care. Below is a short summary of what we discussed at your visit today:  Today we took out 6 staples from your head.  Your wound seems to be healing appropriately.  Should you have any pain you can continue to do Tylenol as needed.  If you have any questions or concerns, please do not hesitate to contact us via phone or MyChart message.   Jerre Simon, MD [facility]

## 2023-08-20 NOTE — Progress Notes (Signed)
    SUBJECTIVE:   CHIEF COMPLAINT / HPI:   Patient is a 47 year old male presenting today for suture removal.  Had a laceration on his head about 12 days ago.  Requiring ED visit where staples were placed.  Today patient reports improved pain and denies any drainage at the site of the staple placement.  PERTINENT  PMH / PSH: Reviewed  OBJECTIVE:   BP 135/72   Pulse 95   Ht 5\' 8"  (1.727 m)   Wt 211 lb 12.8 oz (96.1 kg)   SpO2 97%   BMI 32.20 kg/m    Physical Exam General: Alert, well appearing, NAD Cardiovascular: RRR, No Murmurs, Normal S2/S2 Respiratory: CTAB, No wheezing or Rales Head: Healing lacerations, with no surrounding erythema or edema.  No drainage or bleeding.   ASSESSMENT/PLAN:   Head lacerations Exam shows well-healing lacerations with no signs of infection. Given well-healing wound and 12 days status post staple placement removed 6 staples from patient head. Patient tolerated staple removal well.  No drainage or bleeding associated with removal.  Recommend use of Tylenol as needed for pain.  Patient verbalized understanding and agreeable to plan.   Jerre Simon, MD [facility]

## 2023-10-13 NOTE — Progress Notes (Unsigned)
Cardiology Office Note    Patient Name: Matthew Wade. Date of Encounter: 10/13/2023  Primary Care Provider:  Cyndia Skeeters, DO Primary Cardiologist:  None Primary Electrophysiologist: None   Past Medical History    Past Medical History:  Diagnosis Date   Asthma 06/15/2014   CAD (coronary artery disease), native coronary artery 10/2022   Coronary CTA showed a coronary calcium score of 36. moderate noncalcified plaque of 50 to 69%  stenosis o in the proximal first diagonal and minimal stenosis of 1-24% in the proximal LAD.  By FFR the diagonal is not hemodynamically significant   Hyperlipemia    Seizures (HCC)    per pt "granny seizure as child"    History of Present Illness  Matthew Wade. is a 47 y.o. male with PMH of nonobstructive CAD, HLD, seizures, asthma, tobacco abuse who presents today for 34-month follow-up.   Matthew Wade was last seen in our office on 05/01/2023 for 48-month follow-up of chest discomfort.  He had previously underwent Lexiscan Myoview that was normal and was referred to pulmonology due to shortness of breath.  During visit patient was visibly upset and was struggling with suicidal ideations and depression.  He was encouraged to seek care in the ED and was provided suicide hotline number for support.  He was stable from a cardiac perspective.   During today's visit the patient reports*** .  Patient denies chest pain, palpitations, dyspnea, PND, orthopnea, nausea, vomiting, dizziness, syncope, edema, weight gain, or early satiety.  ***Notes: -Last ischemic evaluation: -Last echo: -Interim ED visits: Review of Systems  Please see the history of present illness.    All other systems reviewed and are otherwise negative except as noted above.  Physical Exam    Wt Readings from Last 3 Encounters:  08/20/23 211 lb 12.8 oz (96.1 kg)  08/12/23 211 lb 9.6 oz (96 kg)  05/01/23 207 lb 3.2 oz (94 kg)   HQ:IONGE were no vitals filed for this visit.,There  is no height or weight on file to calculate BMI. GEN: Well nourished, well developed in no acute distress Neck: No JVD; No carotid bruits Pulmonary: Clear to auscultation without rales, wheezing or rhonchi  Cardiovascular: Normal rate. Regular rhythm. Normal S1. Normal S2.   Murmurs: There is no murmur.  ABDOMEN: Soft, non-tender, non-distended EXTREMITIES:  No edema; No deformity   EKG/LABS/ Recent Cardiac Studies   ECG personally reviewed by me today - ***  Risk Assessment/Calculations:   {Does this patient have ATRIAL FIBRILLATION?:[id number]}      Lab Results  Component Value Date   WBC 5.5 11/13/2022   HGB 17.9 (H) 11/13/2022   HCT 50.4 11/13/2022   MCV 90.8 11/13/2022   PLT 220 11/13/2022   Lab Results  Component Value Date   CREATININE 0.95 11/13/2022   BUN 18 11/13/2022   NA 137 11/13/2022   K 4.0 11/13/2022   CL 107 11/13/2022   CO2 19 (L) 11/13/2022   Lab Results  Component Value Date   CHOL 143 11/27/2022   HDL 37 (L) 11/27/2022   LDLCALC 85 11/27/2022   TRIG 118 11/27/2022   CHOLHDL 3.9 11/27/2022    Lab Results  Component Value Date   HGBA1C 5.8 (H) 12/22/2021   Assessment & Plan    1.  Nonobstructive CAD:  2. Shortness of breath:   3.  Mixed HLD:  4.  GERD:      Disposition: Follow-up with None or APP in *** months {Are you  ordering a CV Procedure (e.g. stress test, cath, DCCV, TEE, etc)?   Press F2        :[id number]}   Signed, Napoleon Form, Leodis Rains, NP 10/13/2023, 3:40 PM [facility]

## 2023-10-14 ENCOUNTER — Ambulatory Visit: Payer: Medicare HMO | Admitting: Nurse Practitioner

## 2023-10-14 ENCOUNTER — Ambulatory Visit: Payer: Medicare HMO | Admitting: Primary Care

## 2023-10-14 ENCOUNTER — Encounter (HOSPITAL_BASED_OUTPATIENT_CLINIC_OR_DEPARTMENT_OTHER): Payer: Medicare HMO

## 2023-10-18 ENCOUNTER — Other Ambulatory Visit: Payer: Self-pay | Admitting: Nurse Practitioner

## 2023-10-20 ENCOUNTER — Other Ambulatory Visit: Payer: Self-pay | Admitting: Family Medicine

## 2023-11-12 ENCOUNTER — Other Ambulatory Visit: Payer: Self-pay | Admitting: Family Medicine

## 2023-11-12 ENCOUNTER — Ambulatory Visit: Payer: Medicare HMO | Admitting: Student

## 2023-11-12 VITALS — BP 127/84 | HR 83 | Ht 68.0 in | Wt 212.0 lb

## 2023-11-12 DIAGNOSIS — J029 Acute pharyngitis, unspecified: Secondary | ICD-10-CM

## 2023-11-12 LAB — POC SOFIA 2 FLU + SARS ANTIGEN FIA
Influenza A, POC: NEGATIVE
Influenza B, POC: NEGATIVE
SARS Coronavirus 2 Ag: NEGATIVE

## 2023-11-12 MED ORDER — BENZONATATE 100 MG PO CAPS: 100.0000 mg | ORAL_CAPSULE | Freq: Two times a day (BID) | ORAL | 0 refills | Status: DC | PRN

## 2023-11-12 NOTE — Patient Instructions (Signed)
 It was great to see you! Thank you for allowing me to participate in your care!   Our plans for today:  - I discussed with Dr. Miriam your lung exam looks normal and your family has this as well I recommend symptomatic treatment. I can send you in medication to help with the cough. If worsening or not improving in 2 weeks I recommend letting us  know and we can get a chest x ray  Take care and seek immediate care sooner if you develop any concerns.  Wendel Lesch, MD

## 2023-11-12 NOTE — Progress Notes (Signed)
    SUBJECTIVE:   CHIEF COMPLAINT / HPI: Sick symptoms  Sandpaper feeling in throat, sore throat Felt hot this morning  Feeling weak all over Feels like pneumonia per patient Every one in house been having sick symptoms Has been going on for 2 days - started on Saturday night Not eating well Recreational marijuana but no longer smoking cigarettes Quit smoking 4 years ago started at 48 years old - 1-2 PPD Coughing up green phlegm Keeping fluids down okay Hx of asthma but no increased use of albuterol   PERTINENT  PMH / PSH: CAD, reflux,   OBJECTIVE:   BP 127/84   Pulse 83   Ht 5' 8 (1.727 m)   Wt 212 lb (96.2 kg)   SpO2 96%   BMI 32.23 kg/m   General: Nontoxic, NAD, awake, alert, responsive to questions Head: Normocephalic atraumatic, nasal congestion, oropharynx erythematous without exudates, no cervical adenopathy CV: Regular rate and rhythm no murmurs rubs or gallops Respiratory: Clear to ausculation bilaterally, no wheezes rales or crackles, chest rises symmetrically,  no increased work of breathing on RA Abdomen: Soft, non-tender, non-distended, normoactive bowel sounds  Extremities: Moves upper and lower extremities freely, no edema in LE, no calf tenderness  ASSESSMENT/PLAN:   Assessment & Plan Sore throat Symptoms most consistent with viral URI. No sign of asthma exacerbation. Flu/COVID negative here. -Symptomatic mesaures -Prescribe Tessalon  Perles for cough  -If no improvement in 2 weeks, order chest x-ray   Wendel Lesch, MD [facility]

## 2023-12-04 ENCOUNTER — Other Ambulatory Visit: Payer: Self-pay | Admitting: Nurse Practitioner

## 2023-12-04 NOTE — Telephone Encounter (Signed)
 Pt pharmacy is requesting a refill on albuterol  inhaler. Would Matthew Connor, NP like to refill this non cardiac medication? Please address

## 2023-12-09 ENCOUNTER — Ambulatory Visit: Payer: Medicare HMO | Admitting: Primary Care

## 2023-12-15 NOTE — Progress Notes (Deleted)
 Cardiology Office Note    Patient Name: Matthew Wade. Date of Encounter: 12/15/2023  Primary Care Provider:  Cyndia Skeeters, DO Primary Cardiologist:  None Primary Electrophysiologist: None   Past Medical History    Past Medical History:  Diagnosis Date   Asthma 06/15/2014   CAD (coronary artery disease), native coronary artery 10/2022   Coronary CTA showed a coronary calcium score of 36. moderate noncalcified plaque of 50 to 69%  stenosis o in the proximal first diagonal and minimal stenosis of 1-24% in the proximal LAD.  By FFR the diagonal is not hemodynamically significant   Hyperlipemia    Seizures (HCC)    per pt "granny seizure as child"    History of Present Illness  Matthew Wade. is a 48 y.o. male with PMH of nonobstructive CAD, HLD, seizures, asthma, tobacco abuse who presents today for 33-month follow-up.    Matthew Wade was last seen in our office on 05/01/2023 for 72-month follow-up of chest discomfort.  He had previously underwent Lexiscan Myoview that was normal and was referred to pulmonology due to shortness of breath.  During visit patient was visibly upset and was struggling with suicidal ideations and depression.  He was encouraged to seek care in the ED and was provided suicide hotline number for support.  He was stable from a cardiac perspective and was recently seen by PCP with complaint of sore throat with negative flu and COVID.  During today's visit the patient reports*** .  Patient denies chest pain, palpitations, dyspnea, PND, orthopnea, nausea, vomiting, dizziness, syncope, edema, weight gain, or early satiety.  ***Notes: -Last ischemic evaluation: -Last echo: -Interim ED visits: Review of Systems  Please see the history of present illness.    All other systems reviewed and are otherwise negative except as noted above.  Physical Exam    Wt Readings from Last 3 Encounters:  11/12/23 212 lb (96.2 kg)  08/20/23 211 lb 12.8 oz (96.1 kg)  08/12/23  211 lb 9.6 oz (96 kg)   ZO:XWRUE were no vitals filed for this visit.,There is no height or weight on file to calculate BMI. GEN: Well nourished, well developed in no acute distress Neck: No JVD; No carotid bruits Pulmonary: Clear to auscultation without rales, wheezing or rhonchi  Cardiovascular: Normal rate. Regular rhythm. Normal S1. Normal S2.   Murmurs: There is no murmur.  ABDOMEN: Soft, non-tender, non-distended EXTREMITIES:  No edema; No deformity   EKG/LABS/ Recent Cardiac Studies   ECG personally reviewed by me today - ***  Risk Assessment/Calculations:   {Does this patient have ATRIAL FIBRILLATION?:([phone]}      Lab Results  Component Value Date   WBC 5.5 11/13/2022   HGB 17.9 (H) 11/13/2022   HCT 50.4 11/13/2022   MCV 90.8 11/13/2022   PLT 220 11/13/2022   Lab Results  Component Value Date   CREATININE 0.95 11/13/2022   BUN 18 11/13/2022   NA 137 11/13/2022   K 4.0 11/13/2022   CL 107 11/13/2022   CO2 19 (L) 11/13/2022   Lab Results  Component Value Date   CHOL 143 11/27/2022   HDL 37 (L) 11/27/2022   LDLCALC 85 11/27/2022   TRIG 118 11/27/2022   CHOLHDL 3.9 11/27/2022    Lab Results  Component Value Date   HGBA1C 5.8 (H) 12/22/2021   Assessment & Plan    1.  Nonobstructive CAD: -Coronary CTA completed showing no significant stenosis with calcium score of 36.9 and nuclear stress test that  showed no evidence of ischemia   2. Shortness of breath:  -Patient former 25 pack/year smoker quit in 2022 -Patient evaluated by pulmonology with PFTs ordered for further evaluation  -Today patient reports***  3.  Mixed hyperlipidemia: -Patient's LDL cholesterol was*** -Continue Crestor 20 mg daily   4.  GERD:  5.  Anxiety and depression:      Disposition: Follow-up with None or APP in *** months {Are you ordering a CV Procedure (e.g. stress test, cath, DCCV, TEE, etc)?   Press F2        :[id number]}   Signed, Napoleon Form, Leodis Rains, NP 12/15/2023,  4:14 PM [facility]

## 2023-12-16 ENCOUNTER — Ambulatory Visit: Payer: 59 | Admitting: Nurse Practitioner

## 2023-12-16 DIAGNOSIS — I2510 Atherosclerotic heart disease of native coronary artery without angina pectoris: Secondary | ICD-10-CM

## 2023-12-16 DIAGNOSIS — E782 Mixed hyperlipidemia: Secondary | ICD-10-CM

## 2023-12-16 DIAGNOSIS — K219 Gastro-esophageal reflux disease without esophagitis: Secondary | ICD-10-CM

## 2023-12-16 DIAGNOSIS — R0602 Shortness of breath: Secondary | ICD-10-CM

## 2023-12-16 DIAGNOSIS — F419 Anxiety disorder, unspecified: Secondary | ICD-10-CM

## 2023-12-24 ENCOUNTER — Encounter (HOSPITAL_BASED_OUTPATIENT_CLINIC_OR_DEPARTMENT_OTHER): Payer: Self-pay | Admitting: Primary Care

## 2023-12-24 ENCOUNTER — Ambulatory Visit: Payer: Medicare HMO | Admitting: Primary Care

## 2023-12-24 ENCOUNTER — Encounter (HOSPITAL_BASED_OUTPATIENT_CLINIC_OR_DEPARTMENT_OTHER): Payer: Medicare HMO

## 2024-01-09 ENCOUNTER — Other Ambulatory Visit: Payer: Self-pay

## 2024-01-09 DIAGNOSIS — E782 Mixed hyperlipidemia: Secondary | ICD-10-CM

## 2024-01-10 MED ORDER — ROSUVASTATIN CALCIUM 20 MG PO TABS: 20.0000 mg | ORAL_TABLET | Freq: Every day | ORAL | 3 refills | Status: AC

## 2024-01-16 ENCOUNTER — Other Ambulatory Visit: Payer: Self-pay | Admitting: Family Medicine

## 2024-01-27 NOTE — Progress Notes (Deleted)
 Cardiology Office Note    Patient Name: Matthew Wade. Date of Encounter: 01/27/2024  Primary Care Provider:  Cyndia Skeeters, DO Primary Cardiologist:  None Primary Electrophysiologist: None   Past Medical History    Past Medical History:  Diagnosis Date   Asthma 06/15/2014   CAD (coronary artery disease), native coronary artery 10/2022   Coronary CTA showed a coronary calcium score of 36. moderate noncalcified plaque of 50 to 69%  stenosis o in the proximal first diagonal and minimal stenosis of 1-24% in the proximal LAD.  By FFR the diagonal is not hemodynamically significant   Hyperlipemia    Seizures (HCC)    per pt "granny seizure as child"    History of Present Illness  Matthew Wade. is a 48 y.o. male with PMH of nonobstructive CAD, HLD, seizures, asthma, tobacco abuse who presents today for 20-month follow-up.    Mr. Chien was last seen in our office on 05/01/2023 for 4-month follow-up of chest discomfort.  He had previously underwent Lexiscan Myoview that was normal and was referred to pulmonology due to shortness of breath.  During visit patient was visibly upset and was struggling with suicidal ideations and depression.  He was encouraged to seek care in the ED and was provided suicide hotline number for support.  He was stable from a cardiac perspective and was recently seen by PCP with complaint of sore throat with negative flu and COVID.    Patient denies chest pain, palpitations, dyspnea, PND, orthopnea, nausea, vomiting, dizziness, syncope, edema, weight gain, or early satiety.   Discussed the use of AI scribe software for clinical note transcription with the patient, who gave verbal consent to proceed.  History of Present Illness    ***Notes: -Last ischemic evaluation:  Review of Systems  Please see the history of present illness.    All other systems reviewed and are otherwise negative except as noted above.  Physical Exam    Wt Readings from  Last 3 Encounters:  11/12/23 212 lb (96.2 kg)  08/20/23 211 lb 12.8 oz (96.1 kg)  08/12/23 211 lb 9.6 oz (96 kg)   ZO:XWRUE were no vitals filed for this visit.,There is no height or weight on file to calculate BMI. GEN: Well nourished, well developed in no acute distress Neck: No JVD; No carotid bruits Pulmonary: Clear to auscultation without rales, wheezing or rhonchi  Cardiovascular: Normal rate. Regular rhythm. Normal S1. Normal S2.   Murmurs: There is no murmur.  ABDOMEN: Soft, non-tender, non-distended EXTREMITIES:  No edema; No deformity   EKG/LABS/ Recent Cardiac Studies   ECG personally reviewed by me today - ***  Risk Assessment/Calculations:   {Does this patient have ATRIAL FIBRILLATION?:[phone]}      Lab Results  Component Value Date   WBC 5.5 11/13/2022   HGB 17.9 (H) 11/13/2022   HCT 50.4 11/13/2022   MCV 90.8 11/13/2022   PLT 220 11/13/2022   Lab Results  Component Value Date   CREATININE 0.95 11/13/2022   BUN 18 11/13/2022   NA 137 11/13/2022   K 4.0 11/13/2022   CL 107 11/13/2022   CO2 19 (L) 11/13/2022   Lab Results  Component Value Date   CHOL 143 11/27/2022   HDL 37 (L) 11/27/2022   LDLCALC 85 11/27/2022   TRIG 118 11/27/2022   CHOLHDL 3.9 11/27/2022    Lab Results  Component Value Date   HGBA1C 5.8 (H) 12/22/2021   Assessment & Plan    1.  Nonobstructive CAD: -Coronary CTA completed showing no significant stenosis with calcium score of 36.9 and nuclear stress test that showed no evidence of ischemia   2. Shortness of breath:  -Patient former 38 pack/year smoker quit in 2022 -Patient evaluated by pulmonology with PFTs ordered for further evaluation  -Today patient reports***   3.  Mixed hyperlipidemia: -Patient's LDL cholesterol was*** -Continue Crestor 20 mg daily   4.  GERD:   5.  Anxiety and depression:      Disposition: Follow-up with None or APP in *** months {Are you ordering a CV Procedure (e.g. stress test, cath,  DCCV, TEE, etc)?   Press F2        :[id number]}   Signed, Napoleon Form, Leodis Rains, NP 01/27/2024, 7:45 AM [facility]

## 2024-01-28 ENCOUNTER — Ambulatory Visit: Payer: 59 | Admitting: Nurse Practitioner

## 2024-01-28 DIAGNOSIS — R0602 Shortness of breath: Secondary | ICD-10-CM

## 2024-01-28 DIAGNOSIS — I2510 Atherosclerotic heart disease of native coronary artery without angina pectoris: Secondary | ICD-10-CM

## 2024-01-28 DIAGNOSIS — K219 Gastro-esophageal reflux disease without esophagitis: Secondary | ICD-10-CM

## 2024-01-28 DIAGNOSIS — E782 Mixed hyperlipidemia: Secondary | ICD-10-CM

## 2024-01-28 DIAGNOSIS — F32A Depression, unspecified: Secondary | ICD-10-CM

## 2024-02-12 ENCOUNTER — Other Ambulatory Visit: Payer: Self-pay | Admitting: Family Medicine

## 2024-04-04 ENCOUNTER — Other Ambulatory Visit: Payer: Self-pay | Admitting: Family Medicine

## 2024-04-12 ENCOUNTER — Other Ambulatory Visit: Payer: Self-pay | Admitting: Nurse Practitioner

## 2024-04-30 ENCOUNTER — Other Ambulatory Visit: Payer: Self-pay | Admitting: Family Medicine

## 2024-05-12 NOTE — Progress Notes (Deleted)
 Cardiology Office Note    Patient Name: Matthew Wade. Date of Encounter: 05/12/2024  Primary Care Provider:  Lafe Domino, DO Primary Cardiologist:  None Primary Electrophysiologist: None   Past Medical History    Past Medical History:  Diagnosis Date   Asthma 06/15/2014   CAD (coronary artery disease), native coronary artery 10/2022   Coronary CTA showed a coronary calcium  score of 36. moderate noncalcified plaque of 50 to 69%  stenosis o in the proximal first diagonal and minimal stenosis of 1-24% in the proximal LAD.  By FFR the diagonal is not hemodynamically significant   Hyperlipemia    Seizures (HCC)    per pt granny seizure as child    History of Present Illness  Matthew Wade. is a 48 y.o. male with PMH of nonobstructive CAD, HLD, seizures, asthma, tobacco abuse who presents today for 43-month follow-up.    Matthew Wade was last seen in our office on 05/01/2023 for 47-month follow-up of chest discomfort.  He had previously underwent Lexiscan Myoview  that was normal and was referred to pulmonology due to shortness of breath.  During visit patient was visibly upset and was struggling with suicidal ideations and depression.  He was encouraged to seek care in the ED and was provided suicide hotline number for support.  He was stable from a cardiac perspective and was recently seen by PCP with complaint of sore throat with negative flu and COVID.       Patient denies chest pain, palpitations, dyspnea, PND, orthopnea, nausea, vomiting, dizziness, syncope, edema, weight gain, or early satiety.   Discussed the use of AI scribe software for clinical note transcription with the patient, who gave verbal consent to proceed.  History of Present Illness    ***Notes:   Review of Systems  Please see the history of present illness.    All other systems reviewed and are otherwise negative except as noted above.  Physical Exam    Wt Readings from Last 3 Encounters:   11/12/23 212 lb (96.2 kg)  08/20/23 211 lb 12.8 oz (96.1 kg)  08/12/23 211 lb 9.6 oz (96 kg)   CD:Uyzmz were no vitals filed for this visit.,There is no height or weight on file to calculate BMI. GEN: Well nourished, well developed in no acute distress Neck: No JVD; No carotid bruits Pulmonary: Clear to auscultation without rales, wheezing or rhonchi  Cardiovascular: Normal rate. Regular rhythm. Normal S1. Normal S2.   Murmurs: There is no murmur.  ABDOMEN: Soft, non-tender, non-distended EXTREMITIES:  No edema; No deformity   EKG/LABS/ Recent Cardiac Studies   ECG personally reviewed by me today - ***  Risk Assessment/Calculations:   {Does this patient have ATRIAL FIBRILLATION?:[id number]}      Lab Results  Component Value Date   WBC 5.5 11/13/2022   HGB 17.9 (H) 11/13/2022   HCT 50.4 11/13/2022   MCV 90.8 11/13/2022   PLT 220 11/13/2022   Lab Results  Component Value Date   CREATININE 0.95 11/13/2022   BUN 18 11/13/2022   NA 137 11/13/2022   K 4.0 11/13/2022   CL 107 11/13/2022   CO2 19 (L) 11/13/2022   Lab Results  Component Value Date   CHOL 143 11/27/2022   HDL 37 (L) 11/27/2022   LDLCALC 85 11/27/2022   TRIG 118 11/27/2022   CHOLHDL 3.9 11/27/2022    Lab Results  Component Value Date   HGBA1C 5.8 (H) 12/22/2021   Assessment & Plan    Assessment  and Plan Assessment & Plan  1.  Nonobstructive CAD: -Coronary CTA completed showing no significant stenosis with calcium  score of 36.9 and nuclear stress test that showed no evidence of ischemia   2. Shortness of breath:  -Patient former 47 pack/year smoker quit in 2022 -Patient evaluated by pulmonology with PFTs ordered for further evaluation  -Today patient reports***   3.  Mixed hyperlipidemia: -Patient's LDL cholesterol was*** -Continue Crestor  20 mg daily   4.  GERD:   5.  Anxiety and depression:        Disposition: Follow-up with None or APP in *** months {Are you ordering a CV Procedure  (e.g. stress test, cath, DCCV, TEE, etc)?   Press F2        :[id number]}   Signed, Matthew Wade, Matthew Shove, NP 05/12/2024, 10:46 AM [facility]

## 2024-05-13 ENCOUNTER — Ambulatory Visit: Admitting: Nurse Practitioner

## 2024-05-13 DIAGNOSIS — E782 Mixed hyperlipidemia: Secondary | ICD-10-CM

## 2024-05-13 DIAGNOSIS — K219 Gastro-esophageal reflux disease without esophagitis: Secondary | ICD-10-CM

## 2024-05-13 DIAGNOSIS — F419 Anxiety disorder, unspecified: Secondary | ICD-10-CM

## 2024-05-13 DIAGNOSIS — I2510 Atherosclerotic heart disease of native coronary artery without angina pectoris: Secondary | ICD-10-CM

## 2024-05-24 ENCOUNTER — Other Ambulatory Visit: Payer: Self-pay | Admitting: Family Medicine

## 2024-06-30 NOTE — Progress Notes (Deleted)
    SUBJECTIVE:   CHIEF COMPLAINT / HPI:   Depression ***  PERTINENT  PMH / PSH: ***  OBJECTIVE:   There were no vitals taken for this visit.  ***  ASSESSMENT/PLAN:   Assessment & Plan    Payton Coward, MD [facility]

## 2024-07-01 ENCOUNTER — Ambulatory Visit

## 2024-07-20 ENCOUNTER — Ambulatory Visit

## 2024-07-20 VITALS — Ht 68.0 in | Wt 205.0 lb

## 2024-07-20 DIAGNOSIS — Z0000 Encounter for general adult medical examination without abnormal findings: Secondary | ICD-10-CM

## 2024-07-20 NOTE — Patient Instructions (Signed)
 Matthew Wade,  Thank you for taking the time for your Medicare Wellness Visit. I appreciate your continued commitment to your health goals. Please review the care plan we discussed, and feel free to reach out if I can assist you further.  Medicare recommends these wellness visits once per year to help you and your care team stay ahead of potential health issues. These visits are designed to focus on prevention, allowing your provider to concentrate on managing your acute and chronic conditions during your regular appointments.  Please note that Annual Wellness Visits do not include a physical exam. Some assessments may be limited, especially if the visit was conducted virtually. If needed, we may recommend a separate in-person follow-up with your provider.  Ongoing Care Seeing your primary care provider every 3 to 6 months helps us  monitor your health and provide consistent, personalized care.   Referrals If a referral was made during today's visit and you haven't received any updates within two weeks, please contact the referred provider directly to check on the status.  Recommended Screenings:  Health Maintenance  Topic Date Due   Pneumococcal Vaccine (1 of 2 - PCV) Never done   Hepatitis B Vaccine (1 of 3 - 19+ 3-dose series) Never done   Flu Shot  05/29/2024   COVID-19 Vaccine (2 - 2025-26 season) 06/29/2024   Medicare Annual Wellness Visit  07/20/2025   Colon Cancer Screening  02/07/2026   DTaP/Tdap/Td vaccine (2 - Td or Tdap) 08/07/2033   Hepatitis C Screening  Completed   HIV Screening  Completed   HPV Vaccine  Aged Out   Meningitis B Vaccine  Aged Out       07/20/2024    2:24 PM  Advanced Directives  Does Patient Have a Medical Advance Directive? No  Would patient like information on creating a medical advance directive? No - Patient declined   Advance Care Planning is important because it: Ensures you receive medical care that aligns with your values, goals, and  preferences. Provides guidance to your family and loved ones, reducing the emotional burden of decision-making during critical moments.  Vision: Annual vision screenings are recommended for early detection of glaucoma, cataracts, and diabetic retinopathy. These exams can also reveal signs of chronic conditions such as diabetes and high blood pressure.  Dental: Annual dental screenings help detect early signs of oral cancer, gum disease, and other conditions linked to overall health, including heart disease and diabetes.  Please see the attached documents for additional preventive care recommendations.

## 2024-07-20 NOTE — Progress Notes (Cosign Needed Addendum)
 Because this visit was a virtual/telehealth visit,  certain criteria was not obtained, such a blood pressure, CBG if applicable, and timed get up and go. Any medications not marked as taking were not mentioned during the medication reconciliation part of the visit. Any vitals not documented were not able to be obtained due to this being a telehealth visit or patient was unable to self-report a recent blood pressure reading due to a lack of equipment at home via telehealth. Vitals that have been documented are verbally provided by the patient.   Subjective:   Matthew Wade. is a 48 y.o. who presents for a Medicare Wellness preventive visit.  As a reminder, Annual Wellness Visits don't include a physical exam, and some assessments may be limited, especially if this visit is performed virtually. We may recommend an in-person follow-up visit with your provider if needed.  Visit Complete: Virtual I connected with  Matthew Wade Matthew Wade. on 07/20/24 by a audio enabled telemedicine application and verified that I am speaking with the correct person using two identifiers.  Patient Location: Home  Provider Location: Home Office  I discussed the limitations of evaluation and management by telemedicine. The patient expressed understanding and agreed to proceed.  Vital Signs: Because this visit was a virtual/telehealth visit, some criteria may be missing or patient reported. Any vitals not documented were not able to be obtained and vitals that have been documented are patient reported.  VideoDeclined- This patient declined Librarian, academic. Therefore the visit was completed with audio only.  Persons Participating in Visit: Patient.  AWV Questionnaire: No: Patient Medicare AWV questionnaire was not completed prior to this visit.  Cardiac Risk Factors include: advanced age (>95men, >33 women);obesity (BMI >30kg/m2);male gender;family history of premature cardiovascular  disease;dyslipidemia     Objective:    Today's Vitals   07/20/24 1423  Weight: 205 lb (93 kg)  Height: 5' 8 (1.727 m)  PainSc: 0-No pain   Body mass index is 31.17 kg/m.     07/20/2024    2:24 PM 11/12/2023    2:16 PM 08/20/2023    9:51 AM 08/12/2023    2:09 PM 04/15/2023    2:42 PM 11/13/2022   10:14 AM 11/07/2022   10:07 AM  Advanced Directives  Does Patient Have a Medical Advance Directive? No No No No No No No  Would patient like information on creating a medical advance directive? No - Patient declined No - Patient declined   Yes (MAU/Ambulatory/Procedural Areas - Information given) No - Patient declined     Current Medications (verified) Outpatient Encounter Medications as of 07/20/2024  Medication Sig   albuterol  (VENTOLIN  HFA) 108 (90 Base) MCG/ACT inhaler INHALE 2 PUFFS BY MOUTH EVERY 6 HOURS AS NEEDED FOR WHEEZING FOR SHORTNESS OF BREATH   aspirin  EC 81 MG tablet Take 81 mg by mouth in the morning and at bedtime. Swallow whole.   benzonatate  (TESSALON ) 100 MG capsule Take 1 capsule (100 mg total) by mouth 2 (two) times daily as needed for cough.   budesonide -formoterol  (SYMBICORT ) 160-4.5 MCG/ACT inhaler Inhale 2 puffs into the lungs in the morning and at bedtime.   levocetirizine (XYZAL) 5 MG tablet Take by mouth.   pantoprazole  (PROTONIX ) 40 MG tablet Take 1 tablet by mouth once daily   rosuvastatin  (CRESTOR ) 20 MG tablet Take 1 tablet (20 mg total) by mouth daily.   sucralfate  (CARAFATE ) 1 g tablet TAKE 1 TABLET BY MOUTH 4 TIMES DAILY WITH MEALS AND  AT BEDTIME   No facility-administered encounter medications on file as of 07/20/2024.    Allergies (verified) Diphenhydramine   History: Past Medical History:  Diagnosis Date   Asthma 06/15/2014   CAD (coronary artery disease), native coronary artery 10/2022   Coronary CTA showed a coronary calcium  score of 36. moderate noncalcified plaque of 50 to 69%  stenosis o in the proximal first diagonal and minimal  stenosis of 1-24% in the proximal LAD.  By [facility] the diagonal is not hemodynamically significant   Hyperlipemia    Seizures (HCC)    per pt granny seizure as child   Past Surgical History:  Procedure Laterality Date   COLONOSCOPY WITH PROPOFOL  N/A 02/08/2016   Procedure: COLONOSCOPY WITH PROPOFOL ;  Surgeon: Lamar ONEIDA Holmes, MD;  Location[facility] ENDOSCOPY;  Service: Endoscopy;  Laterality: N/A;   ESOPHAGOGASTRODUODENOSCOPY (EGD) WITH PROPOFOL  N/A 02/08/2016   Procedure: ESOPHAGOGASTRODUODENOSCOPY (EGD) WITH PROPOFOL ;  Surgeon: Lamar ONEIDA Holmes, MD;  Location: [facility] ENDOSCOPY;  Service: Endoscopy;  Laterality: N/A;   HAND SURGERY     Right pinky finger amputation   Family History  Problem Relation Age of Onset   Colon cancer Father 73   Atrial fibrillation Father    Social History   Socioeconomic History   Marital status: Married    Spouse name: Not on file   Number of children: Not on file   Years of education: Not on file   Highest education level: 8th grade  Occupational History   Occupation: Disability    Comment: Legally blind (genetic)  Tobacco Use   Smoking status: Former    Current packs/day: 0.00    Average packs/day: 1 pack/day for 30.0 years (30.0 ttl pk-yrs)    Types: Cigarettes    Start date: 03/19/1991    Quit date: 03/18/2021    Years since quitting: 3.3    Passive exposure: Past   Smokeless tobacco: Never  Substance and Sexual Activity   Alcohol use: Yes    Comment: socail   Drug use: Yes    Types: Marijuana    Comment: years ago   Sexual activity: Yes    Partners: Female  Other Topics Concern   Not on file  Social History Narrative   Not on file   Social Drivers of Health   Financial Resource Strain: Low Risk  (07/20/2024)   Overall Financial Resource Strain (CARDIA)    Difficulty of Paying Living Expenses: Not hard at all  Food Insecurity: No Food Insecurity (07/20/2024)   Hunger Vital Sign    Worried About Running Out of Food in the Last Year:  Never true    Ran Out of Food in the Last Year: Never true  Recent Concern: Food Insecurity - Food Insecurity Present (06/30/2024)   Hunger Vital Sign    Worried About Running Out of Food in the Last Year: Sometimes true    Ran Out of Food in the Last Year: Sometimes true  Transportation Needs: No Transportation Needs (07/20/2024)   PRAPARE - Administrator, Civil Service (Medical): No    Lack of Transportation (Non-Medical): No  Physical Activity: Sufficiently Active (07/20/2024)   Exercise Vital Sign    Days of Exercise per Week: 5 days    Minutes of Exercise per Session: 30 min  Recent Concern: Physical Activity - Insufficiently Active (06/30/2024)   Exercise Vital Sign    Days of Exercise per Week: 2 days    Minutes of Exercise per Session: 20 min  Stress: No  Stress Concern Present (07/20/2024)   [employer] of Occupational Health - Occupational Stress Questionnaire    Feeling of Stress: Not at all  Recent Concern: Stress - Stress Concern Present (06/30/2024)   [employer] of Occupational Health - Occupational Stress Questionnaire    Feeling of Stress: Rather much  Social Connections: Socially Isolated (07/20/2024)   Social Connection and Isolation Panel    Frequency of Communication with Friends and Family: Twice a week    Frequency of Social Gatherings with Friends and Family: Never    Attends Religious Services: Never    Database administrator or Organizations: No    Attends Engineer, structural: Never    Marital Status: Married    Tobacco Counseling Counseling given: Not Answered    Clinical Intake:  Pre-visit preparation completed: Yes  Pain : No/denies pain Pain Score: 0-No pain     BMI - recorded: 31.17 Nutritional Status: BMI > 30  Obese Nutritional Risks: None Diabetes: No  Lab Results  Component Value Date   HGBA1C 5.8 (H) 12/22/2021     How often do you need to have someone help you when you read instructions, pamphlets,  or other written materials from your doctor or pharmacy?: 1 - Never  Interpreter Needed?: No  Information entered by :: Kaydence Baba N. Elea Holtzclaw, LPN.   Activities of Daily Living     07/20/2024    2:27 PM  In your present state of health, do you have any difficulty performing the following activities:  Hearing? 0  Vision? 0  Difficulty concentrating or making decisions? 0  Walking or climbing stairs? 0  Dressing or bathing? 0  Doing errands, shopping? 0  Preparing Food and eating ? N  Using the Toilet? N  In the past six months, have you accidently leaked urine? N  Do you have problems with loss of bowel control? N  Managing your Medications? N  Managing your Finances? N  Housekeeping or managing your Housekeeping? N    Patient Care Team: Lafe Domino, DO as PCP - General (Family Medicine) Shlomo Wilbert SAUNDERS, MD as Consulting Physician (Cardiology) I have updated your Care Teams any recent Medical Services you may have received from other providers in the past year.     Assessment:   This is a routine wellness examination for Matthew Wade.  Hearing/Vision screen Hearing Screening - Comments:: Denies hearing difficulties, no hearing aids.  Vision Screening - Comments:: Wears rx glasses - not up to date with routine eye exams. Patient is legally blind.    Goals Addressed             This Visit's Progress    07/20/2024:       To lose weight.  I would like to lose 15-20 pounds.       Depression Screen     07/20/2024    2:25 PM 11/12/2023    2:11 PM 08/20/2023    9:51 AM 08/12/2023    2:08 PM 04/15/2023    2:41 PM 12/25/2022    8:42 AM 11/07/2022    8:51 AM  PHQ 2/9 Scores  PHQ - 2 Score 2 2 4 1  0 4 6  PHQ- 9 Score 5 12 14 5  13 17     Fall Risk     07/20/2024    2:24 PM 04/15/2023    2:40 PM 08/27/2022   11:56 AM 03/09/2019    9:43 AM  Fall Risk   Falls in the past year? 0 0  0 0   Number falls in past yr: 0 0 0 0   Injury with Fall? 0 0 0   Risk for fall due to  : No Fall Risks No Fall Risks    Follow up Falls evaluation completed Falls prevention discussed;Education provided;Falls evaluation completed  Falls evaluation completed      Data saved with a previous flowsheet row definition    MEDICARE RISK AT HOME:  Medicare Risk at Home Any stairs in or around the home?: No If so, are there any without handrails?: No Home free of loose throw rugs in walkways, pet beds, electrical cords, etc?: Yes Adequate lighting in your home to reduce risk of falls?: Yes Life alert?: No Use of a cane, walker or w/c?: No Grab bars in the bathroom?: No Shower chair or bench in shower?: No Elevated toilet seat or a handicapped toilet?: No  TIMED UP AND GO:  Was the test performed?  No  Cognitive Function: Declined/Normal: No cognitive concerns noted by patient or family. Patient alert, oriented, able to answer questions appropriately and recall recent events. No signs of memory loss or confusion.    07/20/2024    2:24 PM  MMSE - Mini Mental State Exam  Not completed: Unable to complete        07/20/2024    2:28 PM 04/15/2023    2:43 PM  6CIT Screen  What Year? 0 points 0 points  What month? 0 points 0 points  What time? 0 points 0 points  Count back from 20 0 points 0 points  Months in reverse 0 points 0 points  Repeat phrase 0 points 0 points  Total Score 0 points 0 points    Immunizations Immunization History  Administered Date(s) Administered   Influenza,inj,Quad PF,6+ Mos 01/06/2018   Tdap 08/08/2023    Screening Tests Health Maintenance  Topic Date Due   Pneumococcal Vaccine (1 of 2 - PCV) Never done   Hepatitis B Vaccines 19-59 Average Risk (1 of 3 - 19+ 3-dose series) Never done   Influenza Vaccine  05/29/2024   COVID-19 Vaccine (2 - 2025-26 season) 06/29/2024   Medicare Annual Wellness (AWV)  07/20/2025   Colonoscopy  02/07/2026   DTaP/Tdap/Td (2 - Td or Tdap) 08/07/2033   Hepatitis C Screening  Completed   HIV Screening   Completed   HPV VACCINES  Aged Out   Meningococcal B Vaccine  Aged Out    Health Maintenance Items Addressed: Yes Patient aware of current care gaps.  Additional Screening:  Vision Screening: Recommended annual ophthalmology exams for early detection of glaucoma and other disorders of the eye. Is the patient up to date with their annual eye exam?  No  Who is the provider or what is the name of the office in which the patient attends annual eye exams? N/A  Dental Screening: Recommended annual dental exams for proper oral hygiene  Community Resource Referral / Chronic Care Management: CRR required this visit?  No   CCM required this visit?  No   Plan:    I have personally reviewed and noted the following in the patient's chart:   Medical and social history Use of alcohol, tobacco or illicit drugs  Current medications and supplements including opioid prescriptions. Patient is not currently taking opioid prescriptions. Functional ability and status Nutritional status Physical activity Advanced directives List of other physicians Hospitalizations, surgeries, and ER visits in previous 12 months Vitals Screenings to include cognitive, depression, and falls Referrals and appointments  In addition, I have reviewed and discussed with patient certain preventive protocols, quality metrics, and best practice recommendations. A written personalized care plan for preventive services as well as general preventive health recommendations were provided to patient.   Roz LOISE Fuller, LPN   [id number]   After Visit Summary: (MyChart) Due to this being a telephonic visit, the after visit summary with patients personalized plan was offered to patient via MyChart   Notes: Nothing significant to report at this time.

## 2024-07-31 ENCOUNTER — Other Ambulatory Visit: Payer: Self-pay | Admitting: Nurse Practitioner

## 2024-08-18 ENCOUNTER — Other Ambulatory Visit: Payer: Self-pay | Admitting: Medical Genetics

## 2024-09-16 ENCOUNTER — Other Ambulatory Visit: Payer: Self-pay | Admitting: Physician Assistant

## 2024-09-17 ENCOUNTER — Other Ambulatory Visit: Payer: Self-pay | Admitting: Family Medicine

## 2024-10-21 ENCOUNTER — Other Ambulatory Visit: Payer: Self-pay | Admitting: Physician Assistant

## 2024-10-21 MED ORDER — PANTOPRAZOLE SODIUM 40 MG PO TBEC: 40.0000 mg | DELAYED_RELEASE_TABLET | Freq: Every day | ORAL | 0 refills | Status: DC

## 2024-10-31 ENCOUNTER — Other Ambulatory Visit: Payer: Self-pay | Admitting: Cardiology

## 2024-11-12 ENCOUNTER — Ambulatory Visit

## 2024-11-12 VITALS — BP 112/72 | HR 81 | Wt 205.0 lb

## 2024-11-12 DIAGNOSIS — F4323 Adjustment disorder with mixed anxiety and depressed mood: Secondary | ICD-10-CM

## 2024-11-12 DIAGNOSIS — K219 Gastro-esophageal reflux disease without esophagitis: Secondary | ICD-10-CM | POA: Diagnosis not present

## 2024-11-12 DIAGNOSIS — B353 Tinea pedis: Secondary | ICD-10-CM | POA: Diagnosis not present

## 2024-11-12 DIAGNOSIS — Z23 Encounter for immunization: Secondary | ICD-10-CM | POA: Diagnosis not present

## 2024-11-12 DIAGNOSIS — I2510 Atherosclerotic heart disease of native coronary artery without angina pectoris: Secondary | ICD-10-CM | POA: Diagnosis not present

## 2024-11-12 DIAGNOSIS — L84 Corns and callosities: Secondary | ICD-10-CM | POA: Diagnosis not present

## 2024-11-12 DIAGNOSIS — F5101 Primary insomnia: Secondary | ICD-10-CM

## 2024-11-12 MED ORDER — SUCRALFATE 1 G PO TABS: 1.0000 g | ORAL_TABLET | Freq: Two times a day (BID) | ORAL | 0 refills | Status: AC | PRN

## 2024-11-12 MED ORDER — CLOTRIMAZOLE 1 % EX CREA: 1.0000 | TOPICAL_CREAM | Freq: Two times a day (BID) | CUTANEOUS | 0 refills | Status: AC

## 2024-11-12 MED ORDER — PANTOPRAZOLE SODIUM 40 MG PO TBEC: 40.0000 mg | DELAYED_RELEASE_TABLET | Freq: Every day | ORAL | 0 refills | Status: DC

## 2024-11-12 NOTE — Assessment & Plan Note (Signed)
 Discussed risk factor given coronary calcium  score's.  Patient was not taking aspirin  but has made many good lifestyle changes. Encouraged to resume aspirin  81 mg daily

## 2024-11-12 NOTE — Assessment & Plan Note (Signed)
 Needed refills of PPI and Carafate , reports that this is the only regimen that works for him.  Will discuss at PCP follow-up Refilled PPI and Carafate 

## 2024-11-12 NOTE — Patient Instructions (Addendum)
 Thank you for visiting the clinic today, it was good to see you!  Please always bring your medication bottles  In today's visit we discussed:  Corn/callus: Please use baby powder to keep your feet dry, try to avoid wearing her crocs, and always keep the corn/callus pads around the affected area until all symptoms have resolved.  I have also prescribed some foot fungus cream for any affected areas between the toes.  Sleep: I have put in a referral to speak with somebody regarding the stressors of your life and you should be contacted by the referral specialist soon.  Coronary artery disease: Please resume taking the aspirin  daily  Please follow-up in 1 month for insomnia follow up  For any questions, please call the office at [phone] or send me a message in MyChart. Have a great day!  -Fairy Amy, MD  [facility] Family Medicine Resident, PGY-1

## 2024-11-12 NOTE — Progress Notes (Signed)
 "   SUBJECTIVE:   CHIEF COMPLAINT / HPI:   L foot wart: Never had anything else like this on his foot or any other part of body. Sharp, stabbing pain from the small lesion. No new shoes, started 2-3 months ago, gradually worsening. 8/10 at worse pain. Wart patches have not helped. Tried clipping out the hard skin with no improvement. Usually wears his shoes. 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 Also wears crocs around the house and when not working.  Sleep: Goes to sleep around 10 PM and wakes up at 1AM with difficulty falling back asleep, frequent wakening feeling stressed about money and work with racing thoughts. Getting about 5 hours of sleep/night. Daughter has many health complications that also stresses him out. Ongoing for 18 months, currently trying Trazodone with no improvement.  Initially not interested in talking about his feelings but lost his best friend over christmas who was his primary support person emotionally and has been having a hard time adjusting.  CAD: Has only been using the air fryer, no more deep fried foods, but has not been taking the ASA for about 1 month. Cut out soda and alcohol use, only drinking flavored water.  PERTINENT  PMH / PSH: GERD, legally blind, mixed hyperlipidemia  OBJECTIVE:   BP 112/72   Pulse 81   Wt 205 lb (93 kg)   SpO2 96%   BMI 31.17 kg/m    Cardiac: Regular rate and rhythm. Normal S1/S2. No murmurs, rubs, or gallops appreciated. Lungs: Clear bilaterally to ascultation.  Abdomen: Normoactive bowel sounds. No tenderness to deep or light palpation. No rebound or guarding.  Psych: Depressed mood but reactive to conversation, intermittently tearful, some guilt for feelings, but hopeful for improvement, express lots of stress and anxiety regarding recent life events Left foot: Large callus with central clearing over bony prominence of fourth metatarsal, and exquisite tenderness to palpation and hard nodule palpated.  Please  see photo.  Mild rashes with central clearing between digits of left foot.      11/12/2024    1:33 PM 07/20/2024    2:25 PM 11/12/2023    2:11 PM 08/20/2023    9:51 AM 08/12/2023    2:08 PM  Depression screen PHQ 2/9  Decreased Interest 3 1 1 2  0  Down, Depressed, Hopeless 3 1 1 2 1   PHQ - 2 Score 6 2 2 4 1   Altered sleeping 3 2 1 3 3   Tired, decreased energy 3 0 2 1 1   Change in appetite 3 1 3 3  0  Feeling bad or failure about yourself  1 0 3 2 0  Trouble concentrating 0 0 1 1 0  Moving slowly or fidgety/restless 2 0 0 0 0  Suicidal thoughts 0 0 0 0 0  PHQ-9 Score 18 5  12  14  5    Difficult doing work/chores  Not difficult at all Somewhat difficult  Somewhat difficult     Data saved with a previous flowsheet row definition     ASSESSMENT/PLAN:   Procedure: Area was prepped with alcohol swab and callus removed with #11 scalpel.  Patient immediately had pain relief.  Dr. Madelon was supervising during the procedure. Assessment & Plan Corn of foot Tinea pedis of both feet Poorly controlled, new onset, resistant to wart salicylic pads, exquisitely tender.  Suspect callus/corn versus involuted poorly healing verruca plantaris.  Instructed patient to return if symptoms return for cryotherapy.  Also endorsing some itchiness between digits,  has been wearing crocs more recently. Offloading with corn pads Baby powder for socks and shoes when working Encouraged to wear crocs less Return to clinic for further treatment if symptoms return Order sent for total clotrimazole  cream to be applied twice daily until symptoms resolve Corn and callus handout provided Adjustment disorder with mixed anxiety and depressed mood Primary insomnia Scored an 18 on PHQ 9, recently experienced the death of a dear friend, also presenting with poor sleep that has gotten acutely worse but an ongoing issue for 18 months.  Presenting with anxiety component preventing return to sleep.  No suicidal ideation or  homicidal ideation.  There is also significant caregiver stress due to the health of his daughter.  Patient appeared to be considerably better after motivational interviewing today. [facility] referral placed for grief, anxiety, caregiver stress counseling Encounter for immunization Received flu shot today Gastroesophageal reflux disease without esophagitis Needed refills of PPI and Carafate , reports that this is the only regimen that works for him.  Will discuss at PCP follow-up Refilled PPI and Carafate  Coronary artery disease involving native coronary artery of native heart without angina pectoris Discussed risk factor given coronary calcium  score's.  Patient was not taking aspirin  but has made many good lifestyle changes. Encouraged to resume aspirin  81 mg daily   Jolon Degante, MD [facility] "

## 2024-11-16 NOTE — Progress Notes (Unsigned)
 "    Cardiology  Note    Date:  11/17/2024   ID:  Matthew Wade., DOB 1976-06-26, MRN [id number]  PCP:  Lafe Domino, DO  Cardiologist:  Wilbert Bihari, MD   Chief Complaint  Patient presents with   Coronary Artery Disease   Hyperlipidemia    History of Present Illness:  Matthew Wade. is a 49 y.o. male with a hx of asthma, HLD and seizure disorder.  I initially saw him for evaluation of atypical CP/SOB.  He underwent a coronary CTA showing a cor cal score of 36.6 with 50-69% D1 (normal FFR), <25% prox LAD.  2D echo 10/2022 showed EF 60-65%, G1DD.   Seen by Jackee Alberts, NP 04/2023 and SOB had resolved.  He was still having CP at times mainly related to emotional upset.  He also had CP related to meals and had been dx with GERD.  He was on PPI and referred to GI.  The patient is here today.  He has been having a lot of GERD symptoms recently. He was placed on Protonix  by his PCP a year ago.  The Protonix  has been helping but has only been taking 40mg  daily.  He ran out of the Protonix  but was able to get it refilled a few days ago.  He says that his symptoms got worse when he ran out.   He also has had some pin point discomfort over his sternum that is reproducible with palpation.  He denies any exertional CP or pressure, SOB, DOE, PND, orthopnea, LE edema, dizziness, palpitations or syncope.   Past Medical History:  Diagnosis Date   Asthma 06/15/2014   CAD (coronary artery disease), native coronary artery 10/2022   Coronary CTA showed a coronary calcium  score of 36. moderate noncalcified plaque of 50 to 69%  stenosis o in the proximal first diagonal and minimal stenosis of 1-24% in the proximal LAD.  By [facility] the diagonal is not hemodynamically significant   Hyperlipemia    Seizures (HCC)    per pt granny seizure as child    Past Surgical History:  Procedure Laterality Date   COLONOSCOPY WITH PROPOFOL  N/A 02/08/2016   Procedure: COLONOSCOPY WITH PROPOFOL ;  Surgeon: Lamar ONEIDA Holmes, MD;  Location: [facility] ENDOSCOPY;  Service: Endoscopy;  Laterality: N/A;   ESOPHAGOGASTRODUODENOSCOPY (EGD) WITH PROPOFOL  N/A 02/08/2016   Procedure: ESOPHAGOGASTRODUODENOSCOPY (EGD) WITH PROPOFOL ;  Surgeon: Lamar ONEIDA Holmes, MD;  Location: [facility] ENDOSCOPY;  Service: Endoscopy;  Laterality: N/A;   HAND SURGERY     Right pinky finger amputation    Current Medications: Current Meds  Medication Sig   albuterol  (VENTOLIN  HFA) 108 (90 Base) MCG/ACT inhaler INHALE 2 PUFFS BY MOUTH EVERY 6 HOURS AS NEEDED FOR WHEEZING FOR SHORTNESS OF BREATH   aspirin  EC 81 MG tablet Take 81 mg by mouth in the morning and at bedtime. Swallow whole.   budesonide -formoterol  (SYMBICORT ) 160-4.5 MCG/ACT inhaler Inhale 2 puffs into the lungs in the morning and at bedtime.   clotrimazole  (CLOTRIMAZOLE  ANTI-FUNGAL) 1 % cream Apply 1 Application topically 2 (two) times daily.   levocetirizine (XYZAL) 5 MG tablet Take by mouth.   pantoprazole  (PROTONIX ) 40 MG tablet Take 1 tablet (40 mg total) by mouth daily.   rosuvastatin  (CRESTOR ) 20 MG tablet Take 1 tablet (20 mg total) by mouth daily.   sucralfate  (CARAFATE ) 1 g tablet Take 1 tablet (1 g total) by mouth 2 (two) times daily as needed.    Allergies:   Diphenhydramine  Social History   Socioeconomic History   Marital status: Married    Spouse name: Not on file   Number of children: Not on file   Years of education: Not on file   Highest education level: 8th grade  Occupational History   Occupation: Disability    Comment: Legally blind (genetic)  Tobacco Use   Smoking status: Former    Current packs/day: 0.00    Average packs/day: 1 pack/day for 30.0 years (30.0 ttl pk-yrs)    Types: Cigarettes    Start date: 03/19/1991    Quit date: 03/18/2021    Years since quitting: 3.6    Passive exposure: Past   Smokeless tobacco: Never  Substance and Sexual Activity   Alcohol use: Yes    Comment: socail   Drug use: Yes    Types: Marijuana    Comment: years  ago   Sexual activity: Yes    Partners: Female  Other Topics Concern   Not on file  Social History Narrative   Not on file   Social Drivers of Health   Tobacco Use: Medium Risk (11/12/2024)   Patient History    Smoking Tobacco Use: Former    Smokeless Tobacco Use: Never    Passive Exposure: Past  Physicist, Medical Strain: Low Risk (07/20/2024)   Overall Financial Resource Strain (CARDIA)    Difficulty of Paying Living Expenses: Not hard at all  Food Insecurity: No Food Insecurity (07/20/2024)   Epic    Worried About Programme Researcher, Broadcasting/film/video in the Last Year: Never true    Ran Out of Food in the Last Year: Never true  Recent Concern: Food Insecurity - Food Insecurity Present (06/30/2024)   Epic    Worried About Programme Researcher, Broadcasting/film/video in the Last Year: Sometimes true    Ran Out of Food in the Last Year: Sometimes true  Transportation Needs: No Transportation Needs (07/20/2024)   Epic    Lack of Transportation (Medical): No    Lack of Transportation (Non-Medical): No  Physical Activity: Sufficiently Active (07/20/2024)   Exercise Vital Sign    Days of Exercise per Week: 5 days    Minutes of Exercise per Session: 30 min  Recent Concern: Physical Activity - Insufficiently Active (06/30/2024)   Exercise Vital Sign    Days of Exercise per Week: 2 days    Minutes of Exercise per Session: 20 min  Stress: No Stress Concern Present (07/20/2024)   [employer] of Occupational Health - Occupational Stress Questionnaire    Feeling of Stress: Not at all  Recent Concern: Stress - Stress Concern Present (06/30/2024)   [employer] of Occupational Health - Occupational Stress Questionnaire    Feeling of Stress: Rather much  Social Connections: Socially Isolated (07/20/2024)   Social Connection and Isolation Panel    Frequency of Communication with Friends and Family: Twice a week    Frequency of Social Gatherings with Friends and Family: Never    Attends Religious Services: Never    Automotive Engineer or Organizations: No    Attends Banker Meetings: Never    Marital Status: Married  Depression (PHQ2-9): High Risk (11/12/2024)   Depression (PHQ2-9)    PHQ-2 Score: 18  Alcohol Screen: Low Risk (07/20/2024)   Alcohol Screen    Last Alcohol Screening Score (AUDIT): 0  Housing: Low Risk (07/20/2024)   Epic    Unable to Pay for Housing in the Last Year: No    Number of Times Moved  in the Last Year: 0    Homeless in the Last Year: No  Utilities: Not At Risk (07/20/2024)   Epic    Threatened with loss of utilities: No  Health Literacy: Adequate Health Literacy (07/20/2024)   B1300 Health Literacy    Frequency of need for help with medical instructions: Rarely     Family History:  The patient's family history includes Atrial fibrillation in his father; Colon cancer (age of onset: 79) in his father.   ROS:   Please see the history of present illness.    ROS All other systems reviewed and are negative.      No data to display             PHYSICAL EXAM:   VS:  BP 110/70 (BP Location: Left Arm, Patient Position: Sitting, Cuff Size: Normal)   Ht 5' 8 (1.727 m)   Wt 203 lb 12.8 oz (92.4 kg)   SpO2 97%   BMI 30.99 kg/m    GEN: Well nourished, well developed in no acute distress HEENT: Normal NECK: No JVD; No carotid bruits LYMPHATICS: No lymphadenopathy CARDIAC:RRR, no murmurs, rubs, gallops RESPIRATORY:  Clear to auscultation without rales, wheezing or rhonchi  ABDOMEN: Soft, non-tender, non-distended MUSCULOSKELETAL:  No edema; No deformity  SKIN: Warm and dry NEUROLOGIC:  Alert and oriented x 3 PSYCHIATRIC:  Normal affect    Wt Readings from Last 3 Encounters:  11/17/24 203 lb 12.8 oz (92.4 kg)  11/12/24 205 lb (93 kg)  07/20/24 205 lb (93 kg)      Studies/Labs Reviewed:   EKG Interpretation Date/Time:  Tuesday November 17 2024 10:33:59 EST Ventricular Rate:  72 PR Interval:  140 QRS Duration:  96 QT Interval:  364 QTC  Calculation: 398 R Axis:   70  Text Interpretation: Normal sinus rhythm Normal ECG When compared with ECG of 04-Mar-2023 09:26, Vent. rate has decreased BY  42 BPM ST no longer depressed in Inferior leads Confirmed by Shlomo Corning ([id number]) on 11/17/2024 10:36:35 AM    Recent Labs: No results found for requested labs within last 365 days.   Lipid Panel    Component Value Date/Time   CHOL 143 11/27/2022 1108   TRIG 118 11/27/2022 1108   HDL 37 (L) 11/27/2022 1108   CHOLHDL 3.9 11/27/2022 1108   LDLCALC 85 11/27/2022 1108    Additional studies/ records that were reviewed today include:  none    ASSESSMENT:    1. Nonobstructive atherosclerosis of coronary artery   2. Mixed hyperlipidemia       PLAN:  In order of problems listed above:  Atypical Chest Pain ASCAD -Initially complianed of atypical CP that was very sharp and stabbing but also squeezing.   -EKG at PCP office was nonischemic.  -coronary CTA  2024 showed a cor cal score of 36.6 with 50-69% D1 (normal FFR), <25% prox LAD.   -2D echo 10/2022 showed EF 60-65%, G1DD. -CP felt likely related to GERD as well as stress/anxiety -he has had more problems with CP that is typical of his GERD and he ran out of Protonix  a week ago but is now back on it -I am going to get a Stress PET CT to rule out ischemia -Informed Consent   Shared Decision Making/Informed Consent The risks [chest pain, shortness of breath, cardiac arrhythmias, dizziness, blood pressure fluctuations, myocardial infarction, stroke/transient ischemic attack, nausea, vomiting, allergic reaction, radiation exposure, metallic taste sensation and life-threatening complications (estimated to be 1 in 10,000)], benefits (risk  stratification, diagnosing coronary artery disease, treatment guidance) and alternatives of a cardiac PET stress test were discussed in detail with Mr. Cech and he agrees to proceed. -continue ASA 81mg  daily, Crestor  20mg  daily with PRN  refills  HLD -LDL goal < 70 -Check FLP and ALT -continue Crestor  20mg  daily with PRN refills  GERD -he has been on Protonix  40mg  daily but says that it is not strong enough -recently having more issues with GERD that got worse after running out of PPI -I will refer to GI for further evaluation  Time Spent: 20 minutes total time of encounter, including 15 minutes spent in face-to-face patient care on the date of this encounter. This time includes coordination of care and counseling regarding above mentioned problem list. Remainder of non-face-to-face time involved reviewing chart documents/testing relevant to the patient encounter and documentation in the medical record. I have independently reviewed documentation from referring provider  Medication Adjustments/Labs and Tests Ordered: Current medicines are reviewed at length with the patient today.  Concerns regarding medicines are outlined above.  Medication changes, Labs and Tests ordered today are listed in the Patient Instructions below.  There are no Patient Instructions on file for this visit.   Signed, Wilbert Bihari, MD  11/17/2024 10:37 AM    [facility] [address] [place], [place], [place]  [address] Phone: [phone]; Fax: [phone]   "

## 2024-11-17 ENCOUNTER — Other Ambulatory Visit: Payer: Self-pay | Admitting: Medical Genetics

## 2024-11-17 ENCOUNTER — Ambulatory Visit: Attending: Cardiology | Admitting: Cardiology

## 2024-11-17 VITALS — BP 110/70 | Ht 68.0 in | Wt 203.8 lb

## 2024-11-17 DIAGNOSIS — I2510 Atherosclerotic heart disease of native coronary artery without angina pectoris: Secondary | ICD-10-CM | POA: Diagnosis not present

## 2024-11-17 DIAGNOSIS — Z79899 Other long term (current) drug therapy: Secondary | ICD-10-CM | POA: Diagnosis not present

## 2024-11-17 DIAGNOSIS — Z006 Encounter for examination for normal comparison and control in clinical research program: Secondary | ICD-10-CM

## 2024-11-17 DIAGNOSIS — K219 Gastro-esophageal reflux disease without esophagitis: Secondary | ICD-10-CM | POA: Diagnosis not present

## 2024-11-17 DIAGNOSIS — E782 Mixed hyperlipidemia: Secondary | ICD-10-CM | POA: Diagnosis not present

## 2024-11-17 DIAGNOSIS — R079 Chest pain, unspecified: Secondary | ICD-10-CM | POA: Diagnosis not present

## 2024-11-17 NOTE — Addendum Note (Signed)
 Addended by: SHLOMO CORNING R on: 11/17/2024 11:29 AM   Modules accepted: Orders

## 2024-11-17 NOTE — Addendum Note (Signed)
 Addended by: JANIT FRIEZE L on: 11/17/2024 11:02 AM   Modules accepted: Orders

## 2024-11-17 NOTE — Patient Instructions (Signed)
 Medication Instructions:  Your physician recommends that you continue on your current medications as directed. Please refer to the Current Medication list given to you today.  *If you need a refill on your cardiac medications before your next appointment, please call your pharmacy*  Lab Work: Please complete a FASTING lipid panel and an ALT in our first floor lab before you leave today.  If you have labs (blood work) drawn today and your tests are completely normal, you will receive your results only by: MyChart Message (if you have MyChart) OR A paper copy in the mail If you have any lab test that is abnormal or we need to change your treatment, we will call you to review the results.  Testing/Procedures:    Please report to Radiology at the [facility] Main Entrance 30 minutes early for your test.  [address] [place], [place] [address]                         How to Prepare for Your Cardiac PET/CT Stress Test:  Nothing to eat or drink, except water, 3 hours prior to arrival time.  NO caffeine/decaffeinated products, or chocolate 12 hours prior to arrival. (Please note decaffeinated beverages (teas/coffees) still contain caffeine).  If you have caffeine within 12 hours prior, the test will need to be rescheduled.  Medication instructions: Do not take erectile dysfunction medications for 72 hours prior to test (sildenafil, tadalafil) Do not take nitrates (isosorbide mononitrate, Ranexa) the day before or day of test Do not take tamsulosin  the day before or morning of test Hold theophylline containing medications for 12 hours. Hold Dipyridamole 48 hours prior to the test.  You may take your remaining medications with water.  NO perfume, cologne or lotion on chest or abdomen area. FEMALES - Please avoid wearing dresses to this appointment.  Total time is 1 to 2 hours; you may want to bring reading material for the waiting time.  IF YOU THINK YOU MAY BE PREGNANT,  OR ARE NURSING PLEASE INFORM THE TECHNOLOGIST.  In preparation for your appointment, medication and supplies will be purchased.  Appointment availability is limited, so if you need to cancel or reschedule, please call the Radiology Department Scheduler at [phone] 24 hours in advance to avoid a cancellation fee of $100.00  What to Expect When you Arrive:  Once you arrive and check in for your appointment, you will be taken to a preparation room within the Radiology Department.  A technologist or Nurse will obtain your medical history, verify that you are correctly prepped for the exam, and explain the procedure.  Afterwards, an IV will be started in your arm and electrodes will be placed on your skin for EKG monitoring during the stress portion of the exam. Then you will be escorted to the PET/CT scanner.  There, staff will get you positioned on the scanner and obtain a blood pressure and EKG.  During the exam, you will continue to be connected to the EKG and blood pressure machines.  A small, safe amount of a radioactive tracer will be injected in your IV to obtain a series of pictures of your heart along with an injection of a stress agent.    After your Exam:  It is recommended that you eat a meal and drink a caffeinated beverage to counter act any effects of the stress agent.  Drink plenty of fluids for the remainder of the day and urinate frequently  for the first couple of hours after the exam.  Your doctor will inform you of your test results within 7-10 business days.  For more information and frequently asked questions, please visit our website: [email or web address]  For questions about your test or how to prepare for your test, please call: Cardiac Imaging Nurse Navigators Office: [phone]   Follow-Up: At [facility], you and your health needs are our priority.  As part of our continuing mission to provide you with exceptional heart care, our providers  are all part of one team.  This team includes your primary Cardiologist (physician) and Advanced Practice Providers or APPs (Physician Assistants and Nurse Practitioners) who all work together to provide you with the care you need, when you need it.  Your next appointment will be dependent on the results of your testing and it will be with:     Provider:   Dr. Wilbert Bihari, MD   We recommend signing up for the patient portal called MyChart.  Sign up information is provided on this After Visit Summary.  MyChart is used to connect with patients for Virtual Visits (Telemedicine).  Patients are able to view lab/test results, encounter notes, upcoming appointments, etc.  Non-urgent messages can be sent to your provider as well.   To learn more about what you can do with MyChart, go to [email or web address].   Other Instructions Dr. Bihari has referred you to gastroenterology. Someone will call you to set up an appointment.

## 2024-11-17 NOTE — Addendum Note (Signed)
 Addended by: JANIT GENI CROME on: 11/17/2024 10:52 AM   Modules accepted: Orders

## 2024-11-18 ENCOUNTER — Ambulatory Visit: Payer: Self-pay | Admitting: Cardiology

## 2024-11-18 LAB — LIPID PANEL
Chol/HDL Ratio: 3.5 ratio (ref 0.0–5.0)
Cholesterol, Total: 145 mg/dL (ref 100–199)
HDL: 42 mg/dL
LDL Chol Calc (NIH): 90 mg/dL (ref 0–99)
Triglycerides: 66 mg/dL (ref 0–149)
VLDL Cholesterol Cal: 13 mg/dL (ref 5–40)

## 2024-11-18 LAB — ALT: ALT: 26 IU/L (ref 0–44)

## 2024-11-20 ENCOUNTER — Telehealth: Payer: Self-pay | Admitting: *Deleted

## 2024-11-20 NOTE — Progress Notes (Signed)
 Complex Care Management Note  Care Guide Note 11/20/2024 Name: Matthew Wade. MRN: [id number] DOB: [birth year]  Matthew Wade. is a 49 y.o. year old male who sees Lafe Domino, DO for primary care. I reached out to Matthew Wade. by phone today to offer complex care management services.  [name] was given information about Complex Care Management services today including:   The Complex Care Management services include support from the care team which includes your Nurse Care Manager, Clinical Social Worker, or Pharmacist.  The Complex Care Management team is here to help remove barriers to the health concerns and goals most important to you. Complex Care Management services are voluntary, and the patient may decline or stop services at any time by request to their care team member.   Complex Care Management Consent Status: Patient agreed to services and verbal consent obtained.   Follow up plan:  Telephone appointment with complex care management team member scheduled for:  11/30/24  Encounter Outcome:  Patient Scheduled  Harlene Satterfield  [facility]  [facility] Guide  Direct Dial: [phone]  Fax [phone]

## 2024-11-24 ENCOUNTER — Other Ambulatory Visit: Payer: Self-pay

## 2024-11-24 DIAGNOSIS — K219 Gastro-esophageal reflux disease without esophagitis: Secondary | ICD-10-CM

## 2024-11-30 ENCOUNTER — Telehealth: Payer: Self-pay

## 2024-12-04 ENCOUNTER — Telehealth: Payer: Self-pay | Admitting: *Deleted

## 2024-12-04 NOTE — Progress Notes (Signed)
 Complex Care Management Care Guide Note  12/04/2024 Name: Matthew Wade. MRN: [id number] DOB: Dec 06, 1975  Matthew DELENA Claudene Mickey. is a 49 y.o. year old male who is a primary care patient of Lafe Domino, DO and is actively engaged with the care management team. I reached out to Matthew DELENA Claudene Mickey. by phone today to assist with re-scheduling  with the Licensed Clinical Social Worker.  Follow up plan: Unsuccessful telephone outreach attempt made. A HIPAA compliant phone message was left for the patient providing contact information and requesting a return call. Harlene Satterfield  [facility]  [facility] Guide  Direct Dial: [phone]  Fax [phone]

## 2024-12-04 NOTE — Progress Notes (Signed)
 Complex Care Management Care Guide Note  12/04/2024 Name: Lander Eslick. MRN: [id number] DOB: Mar 23, 1976  Ozell DELENA Claudene Mickey. is a 49 y.o. year old male who is a primary care patient of Lafe Domino, DO and is actively engaged with the care management team. I reached out to Ozell DELENA Claudene Mickey. by phone today to assist with re-scheduling  with the Licensed Clinical Social Worker.  Follow up plan: Telephone appointment with complex care management team member scheduled for:  12/09/24  Harlene Satterfield  [facility]  [facility] Guide  Direct Dial: [phone]  Fax [phone]

## 2024-12-09 ENCOUNTER — Telehealth: Payer: Self-pay

## 2024-12-22 ENCOUNTER — Other Ambulatory Visit (HOSPITAL_COMMUNITY)

## 2025-07-22 ENCOUNTER — Encounter
